# Patient Record
Sex: Female | Born: 1940 | Race: White | Hispanic: No | Smoking: Former smoker
Health system: Southern US, Community
[De-identification: ages and names within clinical notes are randomized; demographics above are authoritative.]

## PROBLEM LIST (undated history)

## (undated) DIAGNOSIS — E46 Unspecified protein-calorie malnutrition: Secondary | ICD-10-CM

## (undated) DIAGNOSIS — C439 Malignant melanoma of skin, unspecified: Secondary | ICD-10-CM

## (undated) DIAGNOSIS — I10 Essential (primary) hypertension: Secondary | ICD-10-CM

## (undated) HISTORY — PX: BREAST LUMPECTOMY: SHX2

## (undated) HISTORY — PX: MELANOMA EXCISION: SHX5266

## (undated) HISTORY — PX: TONSILLECTOMY: SUR1361

## (undated) HISTORY — PX: THORACOTOMY: SUR1349

---

## 1999-09-10 ENCOUNTER — Encounter: Admission: RE | Admit: 1999-09-10 | Discharge: 1999-09-10 | Payer: Self-pay | Admitting: Hematology & Oncology

## 1999-09-10 ENCOUNTER — Encounter: Payer: Self-pay | Admitting: Hematology & Oncology

## 2000-09-10 ENCOUNTER — Encounter: Payer: Self-pay | Admitting: Hematology & Oncology

## 2000-09-10 ENCOUNTER — Encounter: Admission: RE | Admit: 2000-09-10 | Discharge: 2000-09-10 | Payer: Self-pay | Admitting: Hematology & Oncology

## 2001-09-12 ENCOUNTER — Encounter: Admission: RE | Admit: 2001-09-12 | Discharge: 2001-09-12 | Payer: Self-pay | Admitting: Hematology & Oncology

## 2001-09-12 ENCOUNTER — Ambulatory Visit (HOSPITAL_COMMUNITY): Admission: RE | Admit: 2001-09-12 | Discharge: 2001-09-12 | Payer: Self-pay | Admitting: Hematology & Oncology

## 2001-09-12 ENCOUNTER — Encounter: Payer: Self-pay | Admitting: Hematology & Oncology

## 2002-09-14 ENCOUNTER — Ambulatory Visit (HOSPITAL_COMMUNITY): Admission: RE | Admit: 2002-09-14 | Discharge: 2002-09-14 | Payer: Self-pay | Admitting: Hematology & Oncology

## 2002-09-14 ENCOUNTER — Encounter: Payer: Self-pay | Admitting: Hematology & Oncology

## 2002-09-14 ENCOUNTER — Encounter: Admission: RE | Admit: 2002-09-14 | Discharge: 2002-09-14 | Payer: Self-pay | Admitting: Hematology & Oncology

## 2002-09-18 ENCOUNTER — Ambulatory Visit (HOSPITAL_COMMUNITY): Admission: RE | Admit: 2002-09-18 | Discharge: 2002-09-18 | Payer: Self-pay | Admitting: Hematology & Oncology

## 2002-09-18 ENCOUNTER — Encounter: Payer: Self-pay | Admitting: Hematology & Oncology

## 2002-10-05 ENCOUNTER — Encounter: Payer: Self-pay | Admitting: Thoracic Surgery (Cardiothoracic Vascular Surgery)

## 2002-10-05 ENCOUNTER — Ambulatory Visit (HOSPITAL_COMMUNITY)
Admission: RE | Admit: 2002-10-05 | Discharge: 2002-10-05 | Payer: Self-pay | Admitting: Thoracic Surgery (Cardiothoracic Vascular Surgery)

## 2002-10-17 ENCOUNTER — Encounter: Payer: Self-pay | Admitting: Thoracic Surgery (Cardiothoracic Vascular Surgery)

## 2002-10-17 ENCOUNTER — Encounter (INDEPENDENT_AMBULATORY_CARE_PROVIDER_SITE_OTHER): Payer: Self-pay | Admitting: Specialist

## 2002-10-17 ENCOUNTER — Inpatient Hospital Stay (HOSPITAL_COMMUNITY)
Admission: RE | Admit: 2002-10-17 | Discharge: 2002-10-23 | Payer: Self-pay | Admitting: Thoracic Surgery (Cardiothoracic Vascular Surgery)

## 2002-10-18 ENCOUNTER — Encounter: Payer: Self-pay | Admitting: Thoracic Surgery (Cardiothoracic Vascular Surgery)

## 2002-10-19 ENCOUNTER — Encounter: Payer: Self-pay | Admitting: Thoracic Surgery (Cardiothoracic Vascular Surgery)

## 2002-10-20 ENCOUNTER — Encounter: Payer: Self-pay | Admitting: Thoracic Surgery (Cardiothoracic Vascular Surgery)

## 2002-10-21 ENCOUNTER — Encounter: Payer: Self-pay | Admitting: Thoracic Surgery (Cardiothoracic Vascular Surgery)

## 2002-11-10 ENCOUNTER — Encounter
Admission: RE | Admit: 2002-11-10 | Discharge: 2002-11-10 | Payer: Self-pay | Admitting: Thoracic Surgery (Cardiothoracic Vascular Surgery)

## 2002-11-10 ENCOUNTER — Encounter: Payer: Self-pay | Admitting: Thoracic Surgery (Cardiothoracic Vascular Surgery)

## 2003-01-29 ENCOUNTER — Encounter: Payer: Self-pay | Admitting: Hematology & Oncology

## 2003-01-29 ENCOUNTER — Ambulatory Visit (HOSPITAL_COMMUNITY): Admission: RE | Admit: 2003-01-29 | Discharge: 2003-01-29 | Payer: Self-pay | Admitting: Hematology & Oncology

## 2003-05-21 ENCOUNTER — Ambulatory Visit (HOSPITAL_COMMUNITY): Admission: RE | Admit: 2003-05-21 | Discharge: 2003-05-21 | Payer: Self-pay | Admitting: Hematology & Oncology

## 2003-08-22 ENCOUNTER — Ambulatory Visit (HOSPITAL_COMMUNITY): Admission: RE | Admit: 2003-08-22 | Discharge: 2003-08-22 | Payer: Self-pay | Admitting: Hematology & Oncology

## 2004-01-09 ENCOUNTER — Ambulatory Visit (HOSPITAL_COMMUNITY): Admission: RE | Admit: 2004-01-09 | Discharge: 2004-01-09 | Payer: Self-pay | Admitting: Hematology & Oncology

## 2004-06-06 ENCOUNTER — Encounter: Admission: RE | Admit: 2004-06-06 | Discharge: 2004-06-06 | Payer: Self-pay | Admitting: Hematology & Oncology

## 2004-07-11 ENCOUNTER — Ambulatory Visit: Payer: Self-pay | Admitting: Hematology & Oncology

## 2004-07-16 ENCOUNTER — Ambulatory Visit (HOSPITAL_COMMUNITY): Admission: RE | Admit: 2004-07-16 | Discharge: 2004-07-16 | Payer: Self-pay | Admitting: Hematology & Oncology

## 2005-01-13 ENCOUNTER — Ambulatory Visit: Payer: Self-pay | Admitting: Hematology & Oncology

## 2005-01-20 ENCOUNTER — Ambulatory Visit (HOSPITAL_COMMUNITY): Admission: RE | Admit: 2005-01-20 | Discharge: 2005-01-20 | Payer: Self-pay | Admitting: Hematology & Oncology

## 2005-07-10 ENCOUNTER — Ambulatory Visit: Payer: Self-pay | Admitting: Hematology & Oncology

## 2005-07-15 ENCOUNTER — Ambulatory Visit (HOSPITAL_COMMUNITY): Admission: RE | Admit: 2005-07-15 | Discharge: 2005-07-15 | Payer: Self-pay | Admitting: Hematology & Oncology

## 2005-07-20 ENCOUNTER — Encounter: Admission: RE | Admit: 2005-07-20 | Discharge: 2005-07-20 | Payer: Self-pay | Admitting: Hematology & Oncology

## 2006-01-05 ENCOUNTER — Ambulatory Visit: Payer: Self-pay | Admitting: Hematology & Oncology

## 2006-01-07 ENCOUNTER — Ambulatory Visit (HOSPITAL_COMMUNITY): Admission: RE | Admit: 2006-01-07 | Discharge: 2006-01-07 | Payer: Self-pay | Admitting: Hematology & Oncology

## 2006-01-07 LAB — CBC WITH DIFFERENTIAL/PLATELET
Basophils Absolute: 0 10*3/uL (ref 0.0–0.1)
Eosinophils Absolute: 0.1 10*3/uL (ref 0.0–0.5)
HGB: 15.3 g/dL (ref 11.6–15.9)
LYMPH%: 24.4 % (ref 14.0–48.0)
MCV: 84.5 fL (ref 81.0–101.0)
MONO#: 0.4 10*3/uL (ref 0.1–0.9)
MONO%: 5.6 % (ref 0.0–13.0)
NEUT#: 5.1 10*3/uL (ref 1.5–6.5)
Platelets: 222 10*3/uL (ref 145–400)
RBC: 5.18 10*6/uL (ref 3.70–5.32)
RDW: 14 % (ref 11.3–14.5)
WBC: 7.5 10*3/uL (ref 3.9–10.0)

## 2006-01-07 LAB — COMPREHENSIVE METABOLIC PANEL
Albumin: 3.9 g/dL (ref 3.5–5.2)
Alkaline Phosphatase: 71 U/L (ref 39–117)
BUN: 11 mg/dL (ref 6–23)
CO2: 29 mEq/L (ref 19–32)
Glucose, Bld: 101 mg/dL — ABNORMAL HIGH (ref 70–99)
Potassium: 4.2 mEq/L (ref 3.5–5.3)
Sodium: 139 mEq/L (ref 135–145)
Total Protein: 6.5 g/dL (ref 6.0–8.3)

## 2006-10-26 ENCOUNTER — Encounter: Admission: RE | Admit: 2006-10-26 | Discharge: 2006-10-26 | Payer: Self-pay | Admitting: Hematology & Oncology

## 2007-01-11 ENCOUNTER — Ambulatory Visit: Payer: Self-pay | Admitting: Hematology & Oncology

## 2007-01-13 ENCOUNTER — Ambulatory Visit (HOSPITAL_COMMUNITY): Admission: RE | Admit: 2007-01-13 | Discharge: 2007-01-13 | Payer: Self-pay | Admitting: Hematology & Oncology

## 2007-01-13 LAB — COMPREHENSIVE METABOLIC PANEL
Albumin: 4.5 g/dL (ref 3.5–5.2)
Alkaline Phosphatase: 83 U/L (ref 39–117)
BUN: 19 mg/dL (ref 6–23)
CO2: 27 mEq/L (ref 19–32)
Calcium: 9.8 mg/dL (ref 8.4–10.5)
Chloride: 105 mEq/L (ref 96–112)
Glucose, Bld: 101 mg/dL — ABNORMAL HIGH (ref 70–99)
Potassium: 4.3 mEq/L (ref 3.5–5.3)
Sodium: 141 mEq/L (ref 135–145)
Total Protein: 7.2 g/dL (ref 6.0–8.3)

## 2007-01-13 LAB — CBC WITH DIFFERENTIAL/PLATELET
Basophils Absolute: 0 10*3/uL (ref 0.0–0.1)
Eosinophils Absolute: 0.1 10*3/uL (ref 0.0–0.5)
HGB: 15.4 g/dL (ref 11.6–15.9)
MONO#: 0.5 10*3/uL (ref 0.1–0.9)
NEUT#: 5.1 10*3/uL (ref 1.5–6.5)
RBC: 5.26 10*6/uL (ref 3.70–5.32)
RDW: 13.6 % (ref 11.3–14.5)
WBC: 7.5 10*3/uL (ref 3.9–10.0)
lymph#: 1.9 10*3/uL (ref 0.9–3.3)

## 2008-01-03 ENCOUNTER — Ambulatory Visit: Payer: Self-pay | Admitting: Hematology & Oncology

## 2008-01-05 ENCOUNTER — Ambulatory Visit (HOSPITAL_BASED_OUTPATIENT_CLINIC_OR_DEPARTMENT_OTHER): Admission: RE | Admit: 2008-01-05 | Discharge: 2008-01-05 | Payer: Self-pay | Admitting: Hematology & Oncology

## 2008-01-05 LAB — CBC WITH DIFFERENTIAL (CANCER CENTER ONLY)
Eosinophils Absolute: 0.1 10*3/uL (ref 0.0–0.5)
HGB: 15.9 g/dL (ref 11.6–15.9)
MCV: 85 fL (ref 81–101)
MONO#: 0.5 10*3/uL (ref 0.1–0.9)
NEUT#: 5.4 10*3/uL (ref 1.5–6.5)
Platelets: 198 10*3/uL (ref 145–400)
RBC: 5.45 10*6/uL — ABNORMAL HIGH (ref 3.70–5.32)
WBC: 8.3 10*3/uL (ref 3.9–10.0)

## 2008-01-05 LAB — COMPREHENSIVE METABOLIC PANEL
Alkaline Phosphatase: 74 U/L (ref 39–117)
BUN: 14 mg/dL (ref 6–23)
CO2: 26 mEq/L (ref 19–32)
Glucose, Bld: 88 mg/dL (ref 70–99)
Total Bilirubin: 0.5 mg/dL (ref 0.3–1.2)
Total Protein: 6.9 g/dL (ref 6.0–8.3)

## 2008-12-26 ENCOUNTER — Ambulatory Visit: Payer: Self-pay | Admitting: Hematology & Oncology

## 2008-12-27 ENCOUNTER — Ambulatory Visit (HOSPITAL_BASED_OUTPATIENT_CLINIC_OR_DEPARTMENT_OTHER): Admission: RE | Admit: 2008-12-27 | Discharge: 2008-12-27 | Payer: Self-pay | Admitting: Hematology & Oncology

## 2008-12-27 ENCOUNTER — Ambulatory Visit: Payer: Self-pay | Admitting: Diagnostic Radiology

## 2008-12-27 LAB — CBC WITH DIFFERENTIAL (CANCER CENTER ONLY)
EOS%: 1.4 % (ref 0.0–7.0)
Eosinophils Absolute: 0.1 10*3/uL (ref 0.0–0.5)
LYMPH#: 2.3 10*3/uL (ref 0.9–3.3)
MCH: 28.8 pg (ref 26.0–34.0)
MONO%: 6.6 % (ref 0.0–13.0)
NEUT#: 4.2 10*3/uL (ref 1.5–6.5)
Platelets: 186 10*3/uL (ref 145–400)
RBC: 5.01 10*6/uL (ref 3.70–5.32)

## 2008-12-27 LAB — COMPREHENSIVE METABOLIC PANEL
ALT: 13 U/L (ref 0–35)
AST: 13 U/L (ref 0–37)
Alkaline Phosphatase: 65 U/L (ref 39–117)
CO2: 29 mEq/L (ref 19–32)
Creatinine, Ser: 0.61 mg/dL (ref 0.40–1.20)
Sodium: 143 mEq/L (ref 135–145)
Total Bilirubin: 0.6 mg/dL (ref 0.3–1.2)
Total Protein: 6.7 g/dL (ref 6.0–8.3)

## 2009-01-09 ENCOUNTER — Encounter: Admission: RE | Admit: 2009-01-09 | Discharge: 2009-01-09 | Payer: Self-pay | Admitting: Hematology & Oncology

## 2009-05-06 ENCOUNTER — Emergency Department: Payer: Self-pay | Admitting: Emergency Medicine

## 2009-05-09 ENCOUNTER — Ambulatory Visit: Payer: Self-pay | Admitting: Internal Medicine

## 2009-12-25 ENCOUNTER — Ambulatory Visit: Payer: Self-pay | Admitting: Hematology & Oncology

## 2010-01-22 ENCOUNTER — Ambulatory Visit: Payer: Self-pay | Admitting: Diagnostic Radiology

## 2010-01-22 ENCOUNTER — Ambulatory Visit (HOSPITAL_BASED_OUTPATIENT_CLINIC_OR_DEPARTMENT_OTHER): Admission: RE | Admit: 2010-01-22 | Discharge: 2010-01-22 | Payer: Self-pay | Admitting: Hematology & Oncology

## 2010-01-22 LAB — CBC WITH DIFFERENTIAL (CANCER CENTER ONLY)
Eosinophils Absolute: 0.1 10*3/uL (ref 0.0–0.5)
HCT: 45.6 % (ref 34.8–46.6)
LYMPH#: 2.7 10*3/uL (ref 0.9–3.3)
LYMPH%: 29 % (ref 14.0–48.0)
MCV: 87 fL (ref 81–101)
MONO#: 0.6 10*3/uL (ref 0.1–0.9)
Platelets: 220 10*3/uL (ref 145–400)
RBC: 5.21 10*6/uL (ref 3.70–5.32)
WBC: 9.4 10*3/uL (ref 3.9–10.0)

## 2010-01-23 LAB — COMPREHENSIVE METABOLIC PANEL
ALT: 14 U/L (ref 0–35)
Albumin: 4.5 g/dL (ref 3.5–5.2)
CO2: 27 mEq/L (ref 19–32)
Calcium: 9.9 mg/dL (ref 8.4–10.5)
Chloride: 106 mEq/L (ref 96–112)
Glucose, Bld: 119 mg/dL — ABNORMAL HIGH (ref 70–99)
Sodium: 145 mEq/L (ref 135–145)
Total Bilirubin: 0.4 mg/dL (ref 0.3–1.2)
Total Protein: 6.9 g/dL (ref 6.0–8.3)

## 2010-01-23 LAB — VITAMIN D 25 HYDROXY (VIT D DEFICIENCY, FRACTURES): Vit D, 25-Hydroxy: 18 ng/mL — ABNORMAL LOW (ref 30–89)

## 2010-01-24 ENCOUNTER — Encounter: Admission: RE | Admit: 2010-01-24 | Discharge: 2010-01-24 | Payer: Self-pay | Admitting: Hematology & Oncology

## 2010-06-07 ENCOUNTER — Other Ambulatory Visit: Payer: Self-pay | Admitting: Hematology & Oncology

## 2010-06-07 DIAGNOSIS — C3490 Malignant neoplasm of unspecified part of unspecified bronchus or lung: Secondary | ICD-10-CM

## 2010-10-03 NOTE — Discharge Summary (Signed)
NAME:  Victoria Castro, Victoria Castro                          ACCOUNT NO.:  [id number]   MEDICAL RECORD NO.:  [id number]                   PATIENT TYPE:  INP   LOCATION:  3312                                 FACILITY:  [facility]   PHYSICIAN:  Salvatore Decent. Dorris Fetch, M.D.         DATE OF BIRTH:  21-Apr-1941   DATE OF ADMISSION:  10/17/2002  DATE OF DISCHARGE:  10/22/2002                                 DISCHARGE SUMMARY   ONCOLOGIST:  Dr. Arlan Organ.   PRIMARY CARE PHYSICIAN:  Dr. Lilyan Punt of the [facility] in [place].   DISCHARGE DIAGNOSIS:  Invasive, moderately differentiated adenocarcinoma,  right lower lobe, all lymph nodes sampled are free of tumor, I-A.   SECONDARY DIAGNOSES:  1. 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  2. History of tobacco habituation with a 30-pack-year history.  3. Hypertension.   PROCEDURES:  On October 17, 2002, bronchoscopy, right video-assisted  thoracoscopy with wedge resection of the superior segment of the right lower  lobe, right thoracotomy, right lower lobectomy, and lymph node biopsies by  Dr. Charlett Lango.  Patient tolerated the procedure well and was  transferred stable to the recovery room.   DISCHARGE DISPOSITION:  Victoria Castro is ready for discharge on  postoperative day number five after undergoing right lower lobectomy.  She  has been afebrile in the postoperative period.  She is ambulating  independently.  Her incision is healing nicely.  She was on epidural  catheter in the immediate postoperative period; the was removed on  postoperative day number two, and she has controlled her pain well with  Tylenol and Percocet.  She did have some postoperative nausea and anorexia,  but this has resolved by postoperative day number four.  She was seen by Dr.  Arlan Organ, oncologist,  and, based on the pathology studies, he does not  favor adjuvant chemotherapy; he wishes to follow up with her in four to six  weeks.  He has ordered estrogen, progesterone, and HER-2 studies on the CCU.  The patient did have two chest tubes postoperatively; they did not leak at  any time in the postoperative period.  The anterior tube was removed on  postoperative day number two and the remaining tube removed on postoperative  day number three.  She had no pneumothorax after removal of her chest tubes.   DISCHARGE MEDICATIONS:  She will go home with the following medications:  1. Percocet 5/325 mg one to two tabs p.o. q.4-6h. p.r.n. pain.  2. Lotrel 5/20 mg one p.o. daily.   ACTIVITY:  1. She is to walk daily to keep up her strength.  2. She is not to drive until she sees Dr. Dorris Fetch.   DIET:  Her discharge diet has no restrictions.  DISCHARGE INSTRUCTIONS:  1. Victoria Castro may shower.  2. She is to call Dr. Sunday Corn office if her incision becomes inflamed     or starts to drain.   FOLLOW UP:  1. She will see Dr. Dorris Fetch on Friday, November 10, 2002, at 1 o'clock in     the afternoon.  2. She is to get a chest x-ray at [facility] one hour     before the visit with Dr. Dorris Fetch.  3. She will see Dr. Myna Hidalgo at [facility] in four to six     weeks; she is to call [phone] to arrange that appointment.   BRIEF HISTORY:  Victoria Castro is a 71 year old female with a history of  hypertension and right breast cancer, treated in 1995; she had a lumpectomy  with axillary dissection, postoperative radiation, and five years of  Tamoxifen.  She has been disease-free since that time.  She has an annual  chest x-ray, primarily because of her smoking habit, which she continues  with an occasional cigarette here and there, and the chest x-ray ordered by  Dr. Myna Hidalgo showed a solitary right, what appeared to be, upper lobe nodule.  A computed tomogram  of the chest was performed; this confirmed the presence  of a 1.8-cm mass in the posterior aspect of the chest, probably in the  superior aspect of the right lower lobe.  This was a symmetrical mass.  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  The remainder of her  abdomen and pelvis exam was negative.  Victoria Castro has a cough, which is  nonproductive; she thinks it is probably due to seasonal allergies.  She has  had an approximately 8-pound weight loss over the past one year, but she has  been trying to lose weight.  She has no excessive sputum production or  history of hemoptysis.  The patient presents on October 17, 2002, for right  video-assisted thoracoscopy and possible right lower lobectomy, depending on  the pathology from wedge resection of the superior segment of the right  lower lobe.  Dr. Dorris Fetch was the surgeon.  Description of hospital  course is as described in the discharge disposition.  The patient went home  on postoperative day number five in stable and improved health.     Maple Mirza, P.A.                    Salvatore Decent Dorris Fetch, M.D.    GM/MEDQ  D:  10/20/2002  T:  10/21/2002  Job:  [id number]   cc:   Rose Phi. Myna Hidalgo, M.D.  [address] [facility]  [place], [place] [address]  Fax: [phone]   Salvatore Decent. Dorris Fetch, M.D.  [address]  [place]  [place] [address]  Fax: [phone]   Scott A. Gerda Diss, M.D.  [address]  [place]  [place] [address]  Fax: [phone]

## 2010-10-03 NOTE — Consult Note (Signed)
NAME:  Victoria Castro, Victoria Castro                          ACCOUNT NO.:  [id number]   MEDICAL RECORD NO.:  [id number]                   PATIENT TYPE:  INP   LOCATION:  3312                                 FACILITY:  [facility]   PHYSICIAN:  Rose Phi. Myna Hidalgo, M.D.              DATE OF BIRTH:  Mar 11, 1941   DATE OF CONSULTATION:  10/18/2002  DATE OF DISCHARGE:                                   CONSULTATION   REFERRING PHYSICIAN:  Salvatore Decent. Dorris Fetch, M.D.   REASON FOR CONSULTATION:  Stage IA versus IB bronchogenic carcinoma of the  right upper lobe.   HISTORY OF PRESENT ILLNESS:  Victoria Castro is a very charming 70 year old white  female who is well known to me.  She was diagnosed with stage 1 infiltrating  ductal carcinoma of the right breast back in 1995.  She underwent  postoperative radiation therapy after the lumpectomy.  She received 5 years  of tamoxifen.   We have done annual chest x-rays on her. This is done because of her smoking  history.  She has a 30-pack-year history of tobacco use.  She still has an  occasional cigarette.   On her last chest x-ray she was found to have a right upper lobe nodule.  This was then confirmed by CT scan of the chest.  A 1.8 cm nodule was noted  in the posterior aspect of the right upper lobe.  There were no other  findings.  There was no mediastinal or hilar adenopathy.  She had no disease  below the diaphragm.  There was no bony disease noted.  She had really  somewhat asymptomatic.  She does have a chronic cough.  There is no  hemoptysis.  There is no shortness of breath.  Her appetite had been good.  She was trying to loose some weight.   She was referred to Dr. Charlett Lango.  He felt that she would be a  good candidate for a VATS resection.   She was subsequently scheduled to have surgery and then her mother passed  away. Her mother had polycythemia vera.   Her surgery was rescheduled for the first of June.   Dr. Dorris Fetch went ahead and  performed a wedge resection of the superior  segment of the right lower lobe.  The mediastinal hilar lymph nodes were  biopsied.  Pathology is still pending.  She actually looks fantastic after  having the surgery by 1-day.  She has a Fentanyl epidural catheter in.  She  does have some nausea.   PAST MEDICAL HISTORY:  1. Hypertension.  2. Stage 1 ductal carcinoma of the right breast, status post lumpectomy.   ALLERGIES:  None known.   MEDICATIONS ON ADMISSION:  1. Lotrel 5/20 one p.o. daily.   SOCIAL HISTORY:  As stated previously.  In additional, there are no  occupational exposures. She has no alcohol use.   FAMILY HISTORY:  Remarkable for coronary artery disease.  Her mother had  polycythemia vera.   REVIEW OF SYSTEMS:  As stated in the history of present illness.  In  addition, there are no fever, sweats or chills. She had no headache.  There  is no bone pain especially with her arthritis.  There is no change in bowel  or bladder habits. She did not note any change in her eating habits.  There  is no swelling of her legs.  There is no pain in the lower legs.  She has  had no skin rashes.   PHYSICAL EXAMINATION:  GENERAL:  This is a fairly well-developed, well-  nourished, white female in no obvious distress.  VITAL SIGNS:  Her vital signs show a temperature of 96.6;  pulse is 110;  respiratory rate 16;  blood pressure is 120/50.  Oxygen saturation on room  air is 96%.  HEAD AND NECK:  Exam shows a normocephalic, atraumatic skull.  She has no  ocular or oral lesions.  No adenopathy is noted.  LUNGS:  Her lungs do show a test tube into the right lateral chest wall.  She does have decreased breath sounds bilaterally.  CARDIAC:  Regular rate and rhythm with no murmurs, thrills, or bruits.  ABDOMEN:  Abdominal exam soft with decreased bowel sounds.  She has no  abdominal mass.  There is no ascites.  There is no hepatosplenomegaly.  BACK:  Exam was not done secondary to the  recent surgery.  She does have the  chest tube in the right lateral chest wall.  EXTREMITIES:  Show no clubbing, cyanosis, or edema.  NEUROLOGIC:  No focal neurological deficits.   LABORATORY DATA:  Her laboratory studies (preop): White cell count 7.6,  hemoglobin 15.3, hematocrit 44.4, platelet count 212.  MCV is 85.  ProTime  12 with a PTT of 26.  Sodium 143, potassium 4.3, BUN 14, creatinine 0.6,  calcium 10 with an albumin of 4.1.  LFTs are within normal limits.   IMPRESSION:  Victoria Castro is a 70 year old female with a 9 year history of stage  I ductal carcinoma of the right breast.  She, in my opinion, has been cured  of this.  I think that the changes of recurrence after 9 years for a stage 1  breast cancer is less than 5%.  I think that we are looking at a second  primary.  We do not have the pathology report back as of yet; but, again, I  suspect that this will be a bronchogenic carcinoma.  I would favor a non-  small cell lung carcinoma.   If this is an adenocarcinoma, we will need to have pathology do  estrogen/progesterone/HER-2 analysis.   If this is bronchogenic carcinoma, I would not favor adjuvant chemotherapy  for Victoria Castro. Although recent studies do suggest a 5% benefit for adjuvant  chemotherapy, I just do not think that Victoria Castro is going to be able to  tolerate the Platinum-based regimens that were employed.   Again, we will wait for the final before making any final recommendations. I  do not believe that she needs any further staging studies.  I would be  highly surprised if she has higher than a stage 1 disease.   I really appreciate the help of Dr. Dorris Fetch in operating on Victoria Castro.  He, as always, has done an outstanding job.   I will make final recommendations once the final path is out.  Rose Phi. Myna Hidalgo, M.D.   PRE/MEDQ  D:  10/18/2002  T:  10/18/2002  Job:  [id number]   cc:   Salvatore Decent. Dorris Fetch, M.D.   [address]  [place]  [place] [address]  Fax: [phone]   [facility]   Rose Phi. Myna Hidalgo, M.D.  [address] [facility]  [place], [place] [address]  Fax: [phone]

## 2010-10-03 NOTE — Op Note (Signed)
NAME:  Victoria Castro, Victoria Castro                          ACCOUNT NO.:  [id number]   MEDICAL RECORD NO.:  [id number]                   PATIENT TYPE:  INP   LOCATION:  3312                                 FACILITY:  [facility]   PHYSICIAN:  Salvatore Decent. Dorris Fetch, M.D.         DATE OF BIRTH:  Apr 03, 1941   DATE OF PROCEDURE:  10/17/2002  DATE OF DISCHARGE:                                 OPERATIVE REPORT   PREOPERATIVE DIAGNOSIS:  Right lung nodule.   POSTOPERATIVE DIAGNOSIS:  Non-small cell carcinoma, superior segment, right  lower lobe.   PROCEDURE:  Bronchoscopy, right VATs wedge resection, right thoracotomy,  right lower lobectomy, mediastinal lymph node dissection.   SURGEON:  Salvatore Decent. Dorris Fetch, M.D.   ASSISTANT:  Maple Mirza, P.A.   ANESTHESIA:  General.   FINDINGS:  Bronchoscopy revealed moderate bloody secretions of unclear  origin, no endobronchial lesions seen.  Normal endobronchial anatomy.  VATS  approximately 2 cm mass in the superior segment of the right lower lobe with  puckering of the visceral pleura.  Normal appearing hilar and mediastinal  lymph nodes.  Frozen section of mass adenocarcinoma with some squamous  features, likely lung primary.   CLINICAL NOTE:  Victoria Castro is a 70 year old female with a history of breast  cancer nine years ago.  She has been disease free since that time.  She  presents with a newly discovered lung mass that was discovered on a routine  screening chest x-ray.  The patient was referred for surgical evaluation.  It was recommended that she undergo bronchoscopy as well as a VATs with  wedge resection and possible lobectomy depending on interoperative findings.  The indications, risks, benefits, alternative treatments, as well as  interoperative decision making were discussed in detail with the patient and  her family.  She understood and accepted the risks and agreed to proceed  with surgery.   OPERATIVE NOTE:  Victoria Castro was brought to the  preop holding area on October 17, 2002.  In the preop holding area, venous access was obtained by anesthesia  but there was difficulty obtaining central venous access.  An arterial blood  pressure monitoring catheter was placed and intravenous antibiotics were  administered.  The patient was taken to the operating room, anesthetized,  and intubated with a single lumen endotracheal.  Flexible fiberoptic  bronchoscopy was carried out via the endotracheal tube.  There was normal  endobronchial anatomy.  There were moderate bloody secretions, primarily on  the right greater than left but no clear source was seen and there were no  endobronchial lesions seen.  The tube was converted to a double lumen  endotracheal tube.  I then performed a right subclavian vein double lumen  catheterization using Seldinger technique and sterile technique.  The vein  was accessed on the first pass, the wire passed easily, and the catheter  passed easily, as well.  There was excellent return from both  ports.  The  catheter was secured with 3-0 silk sutures.  PSOs were placed for DVT  prophylaxis.  The patient was placed in the left lateral decubitus position.  The right chest was prepped and draped in the usual fashion.   A transverse incision was made in the 7th intercostal space in the  midaxillary line.  It was carried through the skin and subcutaneous tissue.  The chest was entered bluntly using a hemostat.  A port was inserted.  The  right lung was deflated.  The patient tolerated single lung ventilation  well.  An additional incision was made in approximately the fifth  intercostal space posteriorly.  Another incision was made in the fifth  intercostal space more anteriorly in the anterior axillary line.  The lung  was then inspected.  The parietal pleural surface appeared normal.  There  was no pleural effusion.  There was a 2 cm mass in the superior segment of  the right lower lobe.  There was puckering of  the visceral pleura.  This  area was excised with multiple firings of an ATB-45 stapler and this wedge  resection was sent for frozen section.  Frozen section showed  adenocarcinoma.  It could not definitely be determined by frozen section  that this was a lung primary but given the appearance on CT scan which was  consistent with a lung primary as well as the appearance thoracoscopically  with involvement of the visceral pleura, all signs pointed to this being a  primary lung cancer.  Therefore, the decision was made to proceed with right  lower lobectomy.   A small posterolateral thoracotomy was performed.  The latissimus muscle was  divided.  The serratus muscle was retracted anteriorly and was spared.  The  sixth rib was shingled posteriorly and the chest was entered through the  fifth intercostal space.  There was good exposure.  The entire lung was  palpated, there were no other palpable nodules within the lung.  The  inferior pulmonary ligament was divided with electrocautery.  There was an  inferior pulmonary ligament node which was taken with the specimen.  The  pleural reflection was divided at the hilum circumferentially.  The patient  had nearly complete fissures and the pleura overlying the pulmonary artery  branches to the lower lobe was incised and the pulmonary arterial branches  were carefully dissected out.  The pulmonary artery was divided with an ATW-  35 stapler.  The superior segmental artery was divided separately.  The  basilar segmental arteries were divided as a unit.  There was level 11 lymph  nodes present at the bronchus, these were sampled and sent separately.  The  lower lobe bronchus was clearly visible.  The TL-30 stapler was placed  across the lower lobe bronchus and tightened.  A chest inflation revealed  good aeration of the upper and middle lobes and no aeration of the lower lobe.  The stapler was fired and the bronchus was divided.  The major  fissure  was completed with the single firing of an ATB-45 stapler.  This  left only the inferior pulmonary vein from the lower lobe which was divided  with an ATW-35 stapler.  The specimen was removed and sent for bronchial  margins.  Mediastinal lymph node dissection was carried out.  The subcarinal  space was explored.  There were large friable nodes in the subcarinal space  though these did appear benign and reactive with no gross evidence of tumor.  Multiple biopsies of all the seven nodes were sent.  The right paratracheal  space was dissected out, the overlying pleura was incised with  electrocautery.  No lymph nodes were resected en mass.  Hemostasis was  achieved with a combination of electrocautery and surgical clips as  indicated.  Level 2 and 4 nodes were sent as a unit.  The level 10 nodes,  likewise, were taken and sent for pathology, as well.   Hemostasis was achieved.  The chest was irrigated with 1 liter of warm  normal saline and 1 gram of vancomycin.  A test inflation to 30 cm of water  revealed no leakage from the bronchial stump or the residual staple lines.  A 28 French straight chest tube was placed anteriorly and a 36 French right  angle tube was placed posteriorly along the paravertebral gutter.  Both were  secured with #1 silk sutures.  The ribs were reapproximated with #2 Vicryl  pericostal sutures, the serratus muscle was reattached laterally with a  running 0 Vicryl suture, the latissimus was closed with running 0 Vicryl  suture, the subcutaneous tissue and skin were closed in standard fashion  with a subcuticular skin closure.  All sponge, instrument, and needle counts  were correct at the end of the procedure.  At  the completion of the procedure, the patient remained intubated in the left  lateral decubitus position.  An epidural catheter was placed by anesthesia.  The patient then was extubated in the operating room and taken to the  recovery room in stable  condition.                                               Salvatore Decent Dorris Fetch, M.D.    SCH/MEDQ  D:  10/17/2002  T:  10/17/2002  Job:  [id number]   cc:   Rose Phi. Myna Hidalgo, M.D.  [address] [facility]  [place], [place] [address]  Fax: [phone]   [name]  [address]  [place]  [place] [address]  Fax: [phone]

## 2010-11-07 ENCOUNTER — Ambulatory Visit: Payer: Self-pay | Admitting: Internal Medicine

## 2011-01-22 ENCOUNTER — Ambulatory Visit (HOSPITAL_BASED_OUTPATIENT_CLINIC_OR_DEPARTMENT_OTHER)
Admission: RE | Admit: 2011-01-22 | Discharge: 2011-01-22 | Disposition: A | Discharge status: Home / Self Care | Payer: Medicare Other | Source: Ambulatory Visit | Attending: Hematology & Oncology | Admitting: Hematology & Oncology

## 2011-01-22 DIAGNOSIS — C3490 Malignant neoplasm of unspecified part of unspecified bronchus or lung: Secondary | ICD-10-CM

## 2011-01-29 ENCOUNTER — Encounter (HOSPITAL_BASED_OUTPATIENT_CLINIC_OR_DEPARTMENT_OTHER): Payer: 59 | Admitting: Hematology & Oncology

## 2011-01-29 ENCOUNTER — Other Ambulatory Visit: Payer: Self-pay | Admitting: Hematology & Oncology

## 2011-01-29 DIAGNOSIS — Z85118 Personal history of other malignant neoplasm of bronchus and lung: Secondary | ICD-10-CM

## 2011-01-29 DIAGNOSIS — Z853 Personal history of malignant neoplasm of breast: Secondary | ICD-10-CM

## 2011-01-29 DIAGNOSIS — C3490 Malignant neoplasm of unspecified part of unspecified bronchus or lung: Secondary | ICD-10-CM

## 2012-01-28 ENCOUNTER — Other Ambulatory Visit (HOSPITAL_BASED_OUTPATIENT_CLINIC_OR_DEPARTMENT_OTHER): Payer: 59

## 2012-02-24 ENCOUNTER — Ambulatory Visit (HOSPITAL_BASED_OUTPATIENT_CLINIC_OR_DEPARTMENT_OTHER): Payer: Medicare Other | Admitting: Hematology & Oncology

## 2012-02-24 ENCOUNTER — Other Ambulatory Visit (HOSPITAL_BASED_OUTPATIENT_CLINIC_OR_DEPARTMENT_OTHER): Payer: Medicare Other | Admitting: Lab

## 2012-02-24 ENCOUNTER — Ambulatory Visit (HOSPITAL_BASED_OUTPATIENT_CLINIC_OR_DEPARTMENT_OTHER)
Admission: RE | Admit: 2012-02-24 | Discharge: 2012-02-24 | Disposition: A | Discharge status: Home / Self Care | Payer: Medicare Other | Source: Ambulatory Visit | Attending: Hematology & Oncology | Admitting: Hematology & Oncology

## 2012-02-24 VITALS — BP 148/80 | HR 87 | Temp 98.0°F | Resp 16 | Ht 62.0 in | Wt 130.0 lb

## 2012-02-24 DIAGNOSIS — C3410 Malignant neoplasm of upper lobe, unspecified bronchus or lung: Secondary | ICD-10-CM

## 2012-02-24 DIAGNOSIS — Z85118 Personal history of other malignant neoplasm of bronchus and lung: Secondary | ICD-10-CM

## 2012-02-24 DIAGNOSIS — E559 Vitamin D deficiency, unspecified: Secondary | ICD-10-CM

## 2012-02-24 DIAGNOSIS — C3490 Malignant neoplasm of unspecified part of unspecified bronchus or lung: Secondary | ICD-10-CM

## 2012-02-24 DIAGNOSIS — Z853 Personal history of malignant neoplasm of breast: Secondary | ICD-10-CM

## 2012-02-24 DIAGNOSIS — C50919 Malignant neoplasm of unspecified site of unspecified female breast: Secondary | ICD-10-CM

## 2012-02-24 DIAGNOSIS — M810 Age-related osteoporosis without current pathological fracture: Secondary | ICD-10-CM

## 2012-02-24 LAB — CBC WITH DIFFERENTIAL (CANCER CENTER ONLY)
BASO#: 0 10*3/uL (ref 0.0–0.2)
Eosinophils Absolute: 0.1 10*3/uL (ref 0.0–0.5)
HCT: 45.4 % (ref 34.8–46.6)
LYMPH%: 28.9 % (ref 14.0–48.0)
MCV: 86 fL (ref 81–101)
MONO#: 0.6 10*3/uL (ref 0.1–0.9)
NEUT%: 61.9 % (ref 39.6–80.0)
RBC: 5.29 10*6/uL (ref 3.70–5.32)
WBC: 8.2 10*3/uL (ref 3.9–10.0)

## 2012-02-24 LAB — CMP (CANCER CENTER ONLY)
ALT(SGPT): 21 U/L (ref 10–47)
BUN, Bld: 17 mg/dL (ref 7–22)
CO2: 29 mEq/L (ref 18–33)
Calcium: 9.7 mg/dL (ref 8.0–10.3)
Chloride: 101 mEq/L (ref 98–108)
Creat: 0.6 mg/dl (ref 0.6–1.2)

## 2012-02-24 NOTE — Patient Instructions (Signed)
Call if problems 

## 2012-02-24 NOTE — Progress Notes (Signed)
This office note has been dictated.

## 2012-02-25 ENCOUNTER — Telehealth: Payer: Self-pay | Admitting: Hematology & Oncology

## 2012-02-25 NOTE — Telephone Encounter (Signed)
Pt aware of 02-22-2013 appointments for lab,CT and MD

## 2012-02-25 NOTE — Progress Notes (Signed)
CC:   [name]  DIAGNOSIS: 1. Stage IA non-small cell lung cancer of the right lung, clinical     remission. 2. Stage IIA (T2 N0 M0) ductal carcinoma of the right breast, clinical     remission.  CURRENT THERAPY:  Observation.  INTERIM HISTORY:  Ms. Lehn comes in for followup.  She comes in yearly. She is doing well.  She has had no problems since we last saw her.  As always, she got a CT scan today.  This is so we can followup on her lung nodules, which she has had.  Thankfully, the CT scan did not show any evidence of active disease in her chest.  She has some postoperative changes.  There is no pathologic lymphadenopathy or suspicious pulmonary nodules.  She has had no change in her medications.  She hates taking medications.  She has had no abdominal pain.  There have been some issues with gallbladder but she has not had this worked on.  She has been traveling.  She went to the beach this summer.  As always, she had a good time.  She has had no leg swelling.  There have been no rashes.  The patient has had no cough or shortness of breath.  There has been no change in bowel or bladder habits.  PHYSICAL EXAMINATION:  This is a elderly but well-nourished white female in no obvious distress.  Vital signs:  98, pulse 93, respiratory rate 16, blood pressure 148/80.  Weight is 130.  Head and neck: Normocephalic, atraumatic skull.  There are no ocular or oral lesions. There are no palpable cervical or supraclavicular lymph nodes.  Lungs: Clear bilaterally.  Cardiac:  Regular rate and rhythm with a normal S1, S2.  There are no murmurs, rubs, or bruits.  Breasts:  The left breast with no masses, edema or erythema.  There is no left axillary adenopathy.  Right breast is somewhat contracted from radiation surgery. There is a well-healed lumpectomy at the 10 o'clock position.  No distinct mass is noted in the right breast.  There is no right axillary adenopathy.  Abdomen:  Soft  with good bowel sounds.  There is no palpable abdominal mass.  There is no palpable hepatosplenomegaly. Back:  Well-healed thoracotomy scar in the right lateral chest wall.  No tenderness is noted over the chest wall scar.  There is no tenderness over the spine, ribs, or hips.  Extremities:  No clubbing, cyanosis or edema.  Neurological:  No focal neurological deficits.  LABORATORY STUDIES:  White cell count is 8.2, hemoglobin 15.5, hematocrit 45.4, platelet count 203.  Sodium 142, potassium 4.3, BUN 17, creatinine 0.6.  Liver function tests normal.  Vitamin D is only 11.  IMPRESSION:  Ms. Holsten is a 71 year old white female with 2 separate primary malignancies.  I really believe that she is cured of both.  She had the non-small-cell lung cancer about 6 years ago.  Her breast cancer was about 11 years ago.  There is still that risk of recurrence.  Ms. Parilla likes to have the peace of mind of a CT scan.  I certainly cannot argue with this.  Her vitamin D is awfully low.  She really needs to be on supplemental vitamin D.  We will see about her taking 2000 units a day.  We will plan to get her back in 1 year.    ______________________________ Josph Macho, M.D. PRE/MEDQ  D:  02/24/2012  T:  02/25/2012  Job:  [id number] 

## 2012-09-23 ENCOUNTER — Ambulatory Visit: Payer: Self-pay | Admitting: Emergency Medicine

## 2012-09-23 LAB — URINALYSIS, COMPLETE
Glucose,UR: NEGATIVE mg/dL (ref 0–75)
Nitrite: NEGATIVE
Ph: 6 (ref 4.5–8.0)

## 2012-09-25 LAB — URINE CULTURE

## 2012-11-30 ENCOUNTER — Ambulatory Visit: Payer: Self-pay | Admitting: Ophthalmology

## 2013-01-04 ENCOUNTER — Ambulatory Visit: Payer: Self-pay | Admitting: Ophthalmology

## 2013-02-22 ENCOUNTER — Ambulatory Visit (HOSPITAL_BASED_OUTPATIENT_CLINIC_OR_DEPARTMENT_OTHER): Payer: Medicare Other | Admitting: Hematology & Oncology

## 2013-02-22 ENCOUNTER — Ambulatory Visit (HOSPITAL_BASED_OUTPATIENT_CLINIC_OR_DEPARTMENT_OTHER)
Admission: RE | Admit: 2013-02-22 | Discharge: 2013-02-22 | Disposition: A | Discharge status: Home / Self Care | Payer: Medicare Other | Source: Ambulatory Visit | Attending: Hematology & Oncology | Admitting: Hematology & Oncology

## 2013-02-22 ENCOUNTER — Other Ambulatory Visit (HOSPITAL_BASED_OUTPATIENT_CLINIC_OR_DEPARTMENT_OTHER): Payer: Medicare Other | Admitting: Lab

## 2013-02-22 VITALS — BP 139/62 | HR 89 | Temp 97.9°F | Resp 14 | Ht 63.0 in | Wt 131.0 lb

## 2013-02-22 DIAGNOSIS — Z9010 Acquired absence of unspecified breast and nipple: Secondary | ICD-10-CM | POA: Insufficient documentation

## 2013-02-22 DIAGNOSIS — Z85118 Personal history of other malignant neoplasm of bronchus and lung: Secondary | ICD-10-CM

## 2013-02-22 DIAGNOSIS — I700 Atherosclerosis of aorta: Secondary | ICD-10-CM | POA: Insufficient documentation

## 2013-02-22 DIAGNOSIS — Z902 Acquired absence of lung [part of]: Secondary | ICD-10-CM | POA: Insufficient documentation

## 2013-02-22 DIAGNOSIS — C50919 Malignant neoplasm of unspecified site of unspecified female breast: Secondary | ICD-10-CM

## 2013-02-22 DIAGNOSIS — C3410 Malignant neoplasm of upper lobe, unspecified bronchus or lung: Secondary | ICD-10-CM

## 2013-02-22 DIAGNOSIS — M810 Age-related osteoporosis without current pathological fracture: Secondary | ICD-10-CM

## 2013-02-22 DIAGNOSIS — Z853 Personal history of malignant neoplasm of breast: Secondary | ICD-10-CM | POA: Insufficient documentation

## 2013-02-22 DIAGNOSIS — I2510 Atherosclerotic heart disease of native coronary artery without angina pectoris: Secondary | ICD-10-CM | POA: Insufficient documentation

## 2013-02-22 DIAGNOSIS — C50911 Malignant neoplasm of unspecified site of right female breast: Secondary | ICD-10-CM

## 2013-02-22 LAB — CBC WITH DIFFERENTIAL (CANCER CENTER ONLY)
BASO%: 0.2 % (ref 0.0–2.0)
HCT: 45.9 % (ref 34.8–46.6)
HGB: 15.4 g/dL (ref 11.6–15.9)
LYMPH#: 2.1 10*3/uL (ref 0.9–3.3)
MONO#: 0.6 10*3/uL (ref 0.1–0.9)
NEUT%: 67.4 % (ref 39.6–80.0)
RDW: 13.9 % (ref 11.1–15.7)
WBC: 8.5 10*3/uL (ref 3.9–10.0)

## 2013-02-22 LAB — CMP (CANCER CENTER ONLY)
ALT(SGPT): 20 U/L (ref 10–47)
AST: 17 U/L (ref 11–38)
Albumin: 4.1 g/dL (ref 3.3–5.5)
BUN, Bld: 17 mg/dL (ref 7–22)
Calcium: 9.8 mg/dL (ref 8.0–10.3)
Chloride: 101 mEq/L (ref 98–108)
Potassium: 4.1 mEq/L (ref 3.3–4.7)
Sodium: 140 mEq/L (ref 128–145)
Total Protein: 7.4 g/dL (ref 6.4–8.1)

## 2013-02-22 NOTE — Progress Notes (Signed)
This office note has been dictated.

## 2013-02-23 LAB — VITAMIN D 25 HYDROXY (VIT D DEFICIENCY, FRACTURES): Vit D, 25-Hydroxy: 33 ng/mL (ref 30–89)

## 2013-02-23 NOTE — Progress Notes (Signed)
CC:   Victoria Castro  DIAGNOSES: 1. 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 2. 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  CURRENT THERAPY:  Observation.  INTERIM HISTORY:  Victoria Castro comes in for her yearly followup.  She is doing well.  She has had no problems since we last saw her a year ago.  She did have a CT scan done today.  I reviewed this with the patient and her daughter.  The CT scan did not show any evidence of recurrent disease.  She does have atherosclerosis.  She had coronary artery calcifications in 2 of her coronary arteries.  Her daughter will see about getting her an appointment with a cardiologist.  Victoria Castro does not take aspirin.  I really told her strongly that she needed to be on aspirin.  One baby aspirin a day would be helpful for her.  She has had no cough.  She has had no nausea or vomiting.  There have been no fevers, sweats, or chills.  There has been no bony pain.  She has had no change in bowel or bladder habits.  There has been no leg swelling.  When we last checked her vitamin D level year ago, it was down to 11.  I am not sure if she is taking any vitamin D or not.  It is on her on med list, so she should be taking it.  PHYSICAL EXAMINATION:  General:  This is an elderly, petite white female in no obvious distress.  Vital signs:  Temperature of 97.9, pulse 89, respiratory rate 14, blood pressure 139/62.  Weight is 131 pounds.  Head and neck:  Normocephalic, atraumatic skull.  There are no ocular or oral lesions.  There are no palpable cervical or supraclavicular lymph nodes. Lungs:  Clear bilaterally.  Cardiac:  Regular rate and rhythm with a normal S1 and S2.  There are no murmurs, rubs, or bruits.  Abdomen: Soft.  She has good bowel sounds.  There is no fluid wave.  There is no palpable abdominal mass.  There is no palpable hepatosplenomegaly. Breasts:  Left breast with no masses, edema, or erythema.  There is  no left axillary adenopathy.  Right breast shows well-healed lumpectomy. She has a lumpectomy scar at the 10 o'clock position.  No obvious mass is noted in the right breast.  There is no right axillary adenopathy. Back:  Some slight kyphosis.  There is no tenderness over the spine, ribs, or hips.  Extremities:  No clubbing, cyanosis, or edema. Neurological:  No focal neurological deficits.  LABORATORY STUDIES:  White cell count is 8.5, hemoglobin 15.4, hematocrit 45.9, platelet count 221.  IMPRESSION:  Victoria Castro is a 72 year old white female with a history of stage I lung cancer and stage I breast cancer.  I really believe that she is cured of these.  It has been 7 years with respect to her lung cancer.  It has been 12 years since she had the breast cancer.  We will go ahead and plan for another yearly followup.  Hopefully, we will see that her vitamin D levels are better.  We will see about another CT scan next year.    ______________________________ Josph Macho, M.D. PRE/MEDQ  D:  02/22/2013  T:  02/23/2013  Job:  [id number]

## 2014-02-20 ENCOUNTER — Other Ambulatory Visit: Payer: Self-pay | Admitting: *Deleted

## 2014-02-20 DIAGNOSIS — C3491 Malignant neoplasm of unspecified part of right bronchus or lung: Secondary | ICD-10-CM

## 2014-02-21 ENCOUNTER — Encounter: Payer: Self-pay | Admitting: Hematology & Oncology

## 2014-02-21 ENCOUNTER — Ambulatory Visit (HOSPITAL_BASED_OUTPATIENT_CLINIC_OR_DEPARTMENT_OTHER): Payer: Medicare Other

## 2014-02-21 ENCOUNTER — Ambulatory Visit (HOSPITAL_BASED_OUTPATIENT_CLINIC_OR_DEPARTMENT_OTHER)
Admission: RE | Admit: 2014-02-21 | Discharge: 2014-02-21 | Disposition: A | Discharge status: Home / Self Care | Payer: Medicare Other | Source: Ambulatory Visit | Attending: Hematology & Oncology | Admitting: Hematology & Oncology

## 2014-02-21 ENCOUNTER — Other Ambulatory Visit (HOSPITAL_BASED_OUTPATIENT_CLINIC_OR_DEPARTMENT_OTHER): Payer: Medicare Other | Admitting: Lab

## 2014-02-21 ENCOUNTER — Ambulatory Visit (HOSPITAL_BASED_OUTPATIENT_CLINIC_OR_DEPARTMENT_OTHER): Payer: Medicare Other | Admitting: Hematology & Oncology

## 2014-02-21 VITALS — BP 151/72 | HR 90 | Temp 97.9°F | Resp 14 | Ht 63.0 in | Wt 124.0 lb

## 2014-02-21 DIAGNOSIS — Z853 Personal history of malignant neoplasm of breast: Secondary | ICD-10-CM

## 2014-02-21 DIAGNOSIS — C3491 Malignant neoplasm of unspecified part of right bronchus or lung: Secondary | ICD-10-CM

## 2014-02-21 DIAGNOSIS — Z85118 Personal history of other malignant neoplasm of bronchus and lung: Secondary | ICD-10-CM

## 2014-02-21 DIAGNOSIS — C3411 Malignant neoplasm of upper lobe, right bronchus or lung: Secondary | ICD-10-CM

## 2014-02-21 DIAGNOSIS — C3410 Malignant neoplasm of upper lobe, unspecified bronchus or lung: Secondary | ICD-10-CM

## 2014-02-21 DIAGNOSIS — C50911 Malignant neoplasm of unspecified site of right female breast: Secondary | ICD-10-CM | POA: Diagnosis not present

## 2014-02-21 DIAGNOSIS — Z23 Encounter for immunization: Secondary | ICD-10-CM

## 2014-02-21 LAB — CBC WITH DIFFERENTIAL (CANCER CENTER ONLY)
BASO#: 0 10*3/uL (ref 0.0–0.2)
BASO%: 0.3 % (ref 0.0–2.0)
EOS%: 0.8 % (ref 0.0–7.0)
Eosinophils Absolute: 0.1 10*3/uL (ref 0.0–0.5)
HCT: 46.4 % (ref 34.8–46.6)
HGB: 15.5 g/dL (ref 11.6–15.9)
LYMPH#: 2.4 10*3/uL (ref 0.9–3.3)
LYMPH%: 31.2 % (ref 14.0–48.0)
MCH: 29.2 pg (ref 26.0–34.0)
MCHC: 33.4 g/dL (ref 32.0–36.0)
MCV: 88 fL (ref 81–101)
MONO#: 0.7 10*3/uL (ref 0.1–0.9)
MONO%: 9.3 % (ref 0.0–13.0)
NEUT%: 58.4 % (ref 39.6–80.0)
NEUTROS ABS: 4.5 10*3/uL (ref 1.5–6.5)
PLATELETS: 204 10*3/uL (ref 145–400)
RBC: 5.3 10*6/uL (ref 3.70–5.32)
RDW: 14 % (ref 11.1–15.7)
WBC: 7.6 10*3/uL (ref 3.9–10.0)

## 2014-02-21 LAB — CMP (CANCER CENTER ONLY)
ALT(SGPT): 20 U/L (ref 10–47)
AST: 18 U/L (ref 11–38)
Albumin: 3.8 g/dL (ref 3.3–5.5)
Alkaline Phosphatase: 54 U/L (ref 26–84)
BILIRUBIN TOTAL: 0.6 mg/dL (ref 0.20–1.60)
BUN: 18 mg/dL (ref 7–22)
CHLORIDE: 100 meq/L (ref 98–108)
CO2: 28 mEq/L (ref 18–33)
CREATININE: 0.6 mg/dL (ref 0.6–1.2)
Calcium: 9.8 mg/dL (ref 8.0–10.3)
GLUCOSE: 94 mg/dL (ref 73–118)
POTASSIUM: 4 meq/L (ref 3.3–4.7)
SODIUM: 141 meq/L (ref 128–145)
Total Protein: 7 g/dL (ref 6.4–8.1)

## 2014-02-21 MED ORDER — INFLUENZA VAC SPLIT QUAD 0.5 ML IM SUSY
0.5000 mL | PREFILLED_SYRINGE | Freq: Once | INTRAMUSCULAR | Status: AC
  Administered 2014-02-21: 0.5 mL via INTRAMUSCULAR
  Filled 2014-02-21: qty 0.5

## 2014-02-21 NOTE — Patient Instructions (Signed)

## 2014-02-21 NOTE — Progress Notes (Signed)
Hematology and Oncology Follow Up Visit  Victoria Castro [id number] [birth year] 73 y.o. 02/21/2014   Principle Diagnosis:  1. 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 2. 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  Current Therapy:    Observation     Interim History:  Ms.  Castro is back for her yearly followup. Since we last saw her, she is doing quite well. She be going to [facility] for vacation to see a son. She be going in a week or so. She be other for about a week.  We did go ahead and do a CT scan on her. This is of her chest. The CT scan did not show any evidence of recurrent disease.  She's had no cough. There is no shortness of breath. She's had no chest wall pain. She had no abdominal pain. Her outside has been good. She's had no change in bowel or bladder habits. There's been no leg swelling. She's had no joint issues.  There's been no bleeding. She's had no weight loss or weight gain. She's had no headache. There's been no mouth sores or swallowing problems.  Medications: Current outpatient prescriptions:amLODipine-benazepril (LOTREL) 5-20 MG per capsule, Take 1 capsule by mouth daily., Disp: , Rfl: ;  Cholecalciferol (VITAMIN D) 2000 UNITS tablet, Take 2,000 Units by mouth daily., Disp: , Rfl: ;  atorvastatin (LIPITOR) 10 MG tablet, Take 10 mg by mouth daily., Disp: , Rfl:   Allergies: No Known Allergies  Past Medical History, Surgical history, Social history, and Family History were reviewed and updated.  Review of Systems: As above  Physical Exam:  height is 5\' 3"  (1.6 m) and weight is 124 lb (56.246 kg). Her oral temperature is 97.9 F (36.6 C). Her blood pressure is 151/72 and her pulse is 90. Her respiration is 14.   Elderly, petite white female in no obvious distress. Vital signs are above. Head and neck exam shows no ocular or oral lesions. She is no palpable cervical or supraclavicular lymph nodes. Lungs are clear. She does arouse,  wheezes or rhonchi. Cardiac exam regular in rhythm with no murmurs, rubs or bruits. Breast exam shows left breast with no masses, edema or erythema. There is no left axillary adenopathy. Right breast shows well-healed lumpectomy at the 10:00 position There might be some slight contraction of the right breast. No distinct masses noted in the right breast. There is no right axillary adenopathy. Abdomen is soft. She has good bowel sounds. There is no fluid wave. There is a palpable liver or spleen tip. Back exam shows no tenderness over the spine, ribs or hips. Her thoracoscopy scars are well-healed on the right lateral chest wall. Extremities shows no clubbing, cyanosis or edema. Neurological exam is nonfocal. Skin exam shows no rashes, ecchymosis or petechia.  Lab Results  Component Value Date   WBC 7.6 02/21/2014   HGB 15.5 02/21/2014   HCT 46.4 02/21/2014   MCV 88 02/21/2014   PLT 204 02/21/2014     Chemistry      Component Value Date/Time   NA 141 02/21/2014 0902   NA 145 01/22/2010 1022   K 4.0 02/21/2014 0902   K 3.9 01/22/2010 1022   CL 100 02/21/2014 0902   CL 106 01/22/2010 1022   CO2 28 02/21/2014 0902   CO2 27 01/22/2010 1022   BUN 18 02/21/2014 0902   BUN 19 01/22/2010 1022   CREATININE 0.6 02/21/2014 0902   CREATININE  0.57 01/22/2010 1022      Component Value Date/Time   CALCIUM 9.8 02/21/2014 0902   CALCIUM 9.9 01/22/2010 1022   ALKPHOS 54 02/21/2014 0902   ALKPHOS 74 01/22/2010 1022   AST 18 02/21/2014 0902   AST 15 01/22/2010 1022   ALT 20 02/21/2014 0902   ALT 14 01/22/2010 1022   BILITOT 0.60 02/21/2014 0902   BILITOT 0.4 01/22/2010 1022         Impression and Plan: Victoria Castro is 73 year old female with 2 separate primaries. She is both a stage I right lung cancer. She states 2 adenocarcinoma of the right breast. I think she is cured of both of these. The breast cancer was 13 years ago. The lung cancer  was 8 years ago.  I think at this point, we can probably just do a chest x-ray on her.  We  will get her back in one year.   Volanda Napoleon, MD 10/7/20155:38 PM

## 2014-02-22 LAB — VITAMIN D 25 HYDROXY (VIT D DEFICIENCY, FRACTURES): VIT D 25 HYDROXY: 24 ng/mL — AB (ref 30–89)

## 2014-02-23 ENCOUNTER — Telehealth: Payer: Self-pay | Admitting: Nurse Practitioner

## 2014-02-23 NOTE — Telephone Encounter (Addendum)
Message copied by Jimmy Footman on Fri Feb 23, 2014  9:57 AM ------      Message from: Burney Gauze R      Created: Thu Feb 22, 2014  6:09 PM       Please call. Her vitamin D level is low. She really needs to be on 2000 units a day of vitamin D. This will decrease her risk of cancer coming back. Pete ------Pt verbalized understanding and appreciation.

## 2014-04-09 ENCOUNTER — Ambulatory Visit: Payer: Self-pay | Admitting: Physician Assistant

## 2015-02-20 ENCOUNTER — Ambulatory Visit (HOSPITAL_BASED_OUTPATIENT_CLINIC_OR_DEPARTMENT_OTHER)
Admission: RE | Admit: 2015-02-20 | Discharge: 2015-02-20 | Disposition: A | Discharge status: Home / Self Care | Payer: Medicare Other | Source: Ambulatory Visit | Attending: Hematology & Oncology | Admitting: Hematology & Oncology

## 2015-02-20 ENCOUNTER — Encounter: Payer: Self-pay | Admitting: Hematology & Oncology

## 2015-02-20 ENCOUNTER — Ambulatory Visit (HOSPITAL_BASED_OUTPATIENT_CLINIC_OR_DEPARTMENT_OTHER): Payer: Medicare Other | Admitting: Hematology & Oncology

## 2015-02-20 ENCOUNTER — Other Ambulatory Visit (HOSPITAL_BASED_OUTPATIENT_CLINIC_OR_DEPARTMENT_OTHER): Payer: Medicare Other

## 2015-02-20 ENCOUNTER — Ambulatory Visit (HOSPITAL_BASED_OUTPATIENT_CLINIC_OR_DEPARTMENT_OTHER): Payer: Medicare Other

## 2015-02-20 VITALS — BP 145/67 | HR 89 | Temp 97.5°F | Resp 16 | Ht 63.0 in | Wt 126.0 lb

## 2015-02-20 DIAGNOSIS — Z86711 Personal history of pulmonary embolism: Secondary | ICD-10-CM

## 2015-02-20 DIAGNOSIS — C3411 Malignant neoplasm of upper lobe, right bronchus or lung: Secondary | ICD-10-CM

## 2015-02-20 DIAGNOSIS — Z23 Encounter for immunization: Secondary | ICD-10-CM

## 2015-02-20 DIAGNOSIS — Z853 Personal history of malignant neoplasm of breast: Secondary | ICD-10-CM

## 2015-02-20 DIAGNOSIS — C3431 Malignant neoplasm of lower lobe, right bronchus or lung: Secondary | ICD-10-CM

## 2015-02-20 DIAGNOSIS — Z85118 Personal history of other malignant neoplasm of bronchus and lung: Secondary | ICD-10-CM | POA: Diagnosis not present

## 2015-02-20 DIAGNOSIS — E559 Vitamin D deficiency, unspecified: Secondary | ICD-10-CM

## 2015-02-20 LAB — COMPREHENSIVE METABOLIC PANEL (CC13)
ALBUMIN: 4 g/dL (ref 3.5–5.0)
ALK PHOS: 67 U/L (ref 40–150)
ALT: 15 U/L (ref 0–55)
AST: 14 U/L (ref 5–34)
Anion Gap: 7 mEq/L (ref 3–11)
BUN: 16.7 mg/dL (ref 7.0–26.0)
CALCIUM: 10 mg/dL (ref 8.4–10.4)
CO2: 28 mEq/L (ref 22–29)
CREATININE: 0.7 mg/dL (ref 0.6–1.1)
Chloride: 107 mEq/L (ref 98–109)
EGFR: 83 mL/min/{1.73_m2} — ABNORMAL LOW (ref >90)
GLUCOSE: 126 mg/dL (ref 70–140)
Potassium: 4 mEq/L (ref 3.5–5.1)
Sodium: 141 mEq/L (ref 136–145)
Total Bilirubin: 0.5 mg/dL (ref 0.20–1.20)
Total Protein: 6.9 g/dL (ref 6.4–8.3)

## 2015-02-20 LAB — CBC WITH DIFFERENTIAL (CANCER CENTER ONLY)
BASO#: 0 10*3/uL (ref 0.0–0.2)
BASO%: 0.2 % (ref 0.0–2.0)
EOS%: 0.7 % (ref 0.0–7.0)
Eosinophils Absolute: 0.1 10*3/uL (ref 0.0–0.5)
HEMATOCRIT: 46.2 % (ref 34.8–46.6)
HEMOGLOBIN: 15.4 g/dL (ref 11.6–15.9)
LYMPH#: 2.4 10*3/uL (ref 0.9–3.3)
LYMPH%: 28.7 % (ref 14.0–48.0)
MCH: 28.8 pg (ref 26.0–34.0)
MCHC: 33.3 g/dL (ref 32.0–36.0)
MCV: 87 fL (ref 81–101)
MONO#: 0.7 10*3/uL (ref 0.1–0.9)
MONO%: 8 % (ref 0.0–13.0)
NEUT%: 62.4 % (ref 39.6–80.0)
NEUTROS ABS: 5.3 10*3/uL (ref 1.5–6.5)
Platelets: 193 10*3/uL (ref 145–400)
RBC: 5.34 10*6/uL — ABNORMAL HIGH (ref 3.70–5.32)
RDW: 13.7 % (ref 11.1–15.7)
WBC: 8.4 10*3/uL (ref 3.9–10.0)

## 2015-02-20 MED ORDER — INFLUENZA VAC SPLIT QUAD 0.5 ML IM SUSY
0.5000 mL | PREFILLED_SYRINGE | Freq: Once | INTRAMUSCULAR | Status: AC
Start: 2015-02-20 — End: 2015-02-20
  Administered 2015-02-20: 0.5 mL via INTRAMUSCULAR
  Filled 2015-02-20: qty 0.5

## 2015-02-20 NOTE — Progress Notes (Signed)
Hematology and Oncology Follow Up Visit  Victoria Castro [id number] 1940/12/18 74 y.o. 02/20/2015   Principle Diagnosis:  1. 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 2. 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  Current Therapy:    Observation     Interim History:  Ms.  Dearcos is back for her yearly followup. Since we last saw her, she is doing quite well.   She had a good time in [place]. Her son lives there. She really enjoyed visiting him.  We did do a chest x-ray on her today. Chest x-ray showed no evidence of recurrent or metastatic lung cancer.  Her appetite is good. She's had no nausea or vomiting. There's been no change in bowel or bladder habits. She's had no leg swelling. She's had no rashes. She's had no weight loss or waking. She's had no increasing fatigue.  Overall, her performance status is ECOG 1.    Medications:  Current outpatient prescriptions:  .  amLODipine-benazepril (LOTREL) 5-20 MG per capsule, Take 1 capsule by mouth daily., Disp: , Rfl:  .  Cholecalciferol (VITAMIN D) 2000 UNITS tablet, Take 2,000 Units by mouth daily., Disp: , Rfl:  .  pravastatin (PRAVACHOL) 10 MG tablet, Take by mouth., Disp: , Rfl:   Current facility-administered medications:  .  Influenza vac split quadrivalent PF (FLUARIX) injection 0.5 mL, 0.5 mL, Intramuscular, Once, Volanda Napoleon, MD  Allergies: No Known Allergies  Past Medical History, Surgical history, Social history, and Family History were reviewed and updated.  Review of Systems: As above  Physical Exam:  height is '5\' 3"'$  (1.6 m) and weight is 126 lb (57.153 kg). Her oral temperature is 97.5 F (36.4 C). Her blood pressure is 145/67 and her pulse is 89. Her respiration is 16.   Elderly, petite white female in no obvious distress. Vital signs are above. Head and neck exam shows no ocular or oral lesions. She is no palpable cervical or supraclavicular lymph nodes. Lungs are clear. She  does arouse, wheezes or rhonchi. Cardiac exam regular in rhythm with no murmurs, rubs or bruits. Breast exam shows left breast with no masses, edema or erythema. There is no left axillary adenopathy. Right breast shows well-healed lumpectomy at the 10:00 position There might be some slight contraction of the right breast. No distinct masses noted in the right breast. There is no right axillary adenopathy. Abdomen is soft. She has good bowel sounds. There is no fluid wave. There is a palpable liver or spleen tip. Back exam shows no tenderness over the spine, ribs or hips. Her thoracoscopy scars are well-healed on the right lateral chest wall. Extremities shows no clubbing, cyanosis or edema. Neurological exam is nonfocal. Skin exam shows no rashes, ecchymosis or petechia.  Lab Results  Component Value Date   WBC 8.4 02/20/2015   HGB 15.4 02/20/2015   HCT 46.2 02/20/2015   MCV 87 02/20/2015   PLT 193 02/20/2015     Chemistry      Component Value Date/Time   NA 141 02/21/2014 0902   NA 145 01/22/2010 1022   K 4.0 02/21/2014 0902   K 3.9 01/22/2010 1022   CL 100 02/21/2014 0902   CL 106 01/22/2010 1022   CO2 28 02/21/2014 0902   CO2 27 01/22/2010 1022   BUN 18 02/21/2014 0902   BUN 19 01/22/2010 1022   CREATININE 0.6 02/21/2014 0902   CREATININE 0.57 01/22/2010 1022  Component Value Date/Time   CALCIUM 9.8 02/21/2014 0902   CALCIUM 9.9 01/22/2010 1022   ALKPHOS 54 02/21/2014 0902   ALKPHOS 74 01/22/2010 1022   AST 18 02/21/2014 0902   AST 15 01/22/2010 1022   ALT 20 02/21/2014 0902   ALT 14 01/22/2010 1022   BILITOT 0.60 02/21/2014 0902   BILITOT 0.4 01/22/2010 1022         Impression and Plan: Ms. Boyan is 74 year old female with 2 separate primaries. She has both a stage I right lung cancer. She had a Stage IIA (T2N0M0) adenocarcinoma of the right breast. I think she is cured of both of these. The breast cancer was 14 years ago. The lung cancer  was 9 years ago.  I  think at this point, we can probably just do a chest x-ray on her.  We will get her back in one year.   Volanda Napoleon, MD 10/5/201611:14 AM

## 2015-02-20 NOTE — Patient Instructions (Signed)

## 2015-02-21 ENCOUNTER — Telehealth: Payer: Self-pay | Admitting: *Deleted

## 2015-02-21 DIAGNOSIS — E559 Vitamin D deficiency, unspecified: Secondary | ICD-10-CM

## 2015-02-21 LAB — VITAMIN D 25 HYDROXY (VIT D DEFICIENCY, FRACTURES): VIT D 25 HYDROXY: 27 ng/mL — AB (ref 30–100)

## 2015-02-21 MED ORDER — ERGOCALCIFEROL 1.25 MG (50000 UT) PO CAPS: 50000.0000 [IU] | ORAL_CAPSULE | ORAL | Status: DC

## 2015-02-21 NOTE — Telephone Encounter (Addendum)
Patient aware of lab results and new prescription. Prescription sent in.   ----- Message from Volanda Napoleon, MD sent at 02/21/2015  7:23 AM EDT ----- Call - vit D is low!! She needs to take 50000 units a week.  Please call this in for her!! Thanks! Laurey Arrow

## 2015-11-06 ENCOUNTER — Telehealth: Payer: Self-pay | Admitting: *Deleted

## 2015-11-06 NOTE — Telephone Encounter (Signed)
On 11-06-15 fax medical records to [facility] attn bridgette ti was consult note, end of tx note, sim and planning note, follow up note, dos.

## 2015-11-14 ENCOUNTER — Telehealth: Payer: Self-pay | Admitting: Oncology

## 2015-11-14 NOTE — Telephone Encounter (Signed)
Lt mess for pt regarding appt for Dr Alen Blew. Contact Dr. Marin Olp regarding new melanoma clinic and pt's wanting to participate in clinic. [facility] records has been requested.

## 2015-12-10 ENCOUNTER — Ambulatory Visit (HOSPITAL_BASED_OUTPATIENT_CLINIC_OR_DEPARTMENT_OTHER): Payer: Medicare Other | Admitting: Oncology

## 2015-12-10 VITALS — BP 183/80 | HR 97 | Temp 97.9°F | Resp 20 | Ht 63.0 in | Wt 121.5 lb

## 2015-12-10 DIAGNOSIS — C439 Malignant melanoma of skin, unspecified: Secondary | ICD-10-CM

## 2015-12-10 DIAGNOSIS — Z85118 Personal history of other malignant neoplasm of bronchus and lung: Secondary | ICD-10-CM

## 2015-12-10 DIAGNOSIS — Z853 Personal history of malignant neoplasm of breast: Secondary | ICD-10-CM

## 2015-12-10 DIAGNOSIS — C430 Malignant melanoma of lip: Secondary | ICD-10-CM

## 2015-12-10 NOTE — Progress Notes (Signed)
Reason for Referral: Melanoma.   HPI: 75 year old woman referred to me for the evaluation for stage III melanoma. She is a pleasant healthy woman with history of breast cancer as well as lung cancer that have been treated remotely. She was diagnosed with breast cancer in 1995 and a lung cancer in 2004 and remained disease-free from that standpoint. She had recurrent nonmelanoma skin cancer that have been resected in the past. She was noticed to have a lesion on the right upper lip and and underwent a biopsy which showed a malignant melanoma in situ. She underwent Mohs procedure in February 2017 which did not show any residual malignancy at that time. She subsequently started developing a tender mass in her right submandibular region which did not improve with antibiotics. Evaluate her aspiration done on 09/16/2015 showed atypical cells suspicious for malignancy. PET scan showed right-sided lymphadenopathy at the level of IIa. She subsequently underwent a repeat biopsy on 10/02/2015 which confirmed the presence of metastatic melanoma.  She is status post neck dissection performed on 10/22/2015 which showed 2 out of 7 lymph nodes involved with metastatic melanoma. Her most recent PET scan was on 09/26/2015 which should not show any metastatic disease. She was evaluated by oncology at [facility]. And recommended adjuvant therapy ipilimumab at a reduced doses. Clinically, she is asymptomatic at this time and reports no residual complications. She have regained all activities of daily living and continues to live independently. She denied any neck pain or discomfort. She denied any constitutional symptoms.  She is not report any headaches, blurry vision, syncope or seizures. She does not report any fevers or chills or sweats. She does not report any cough, wheezing or hemoptysis. She does not report any chest pain, palpitation, orthopnea or leg edema. She does not report any frequency urgency or hesitancy.  She does not report any skeletal complaints. Remaining review of systems unremarkable.    her past medical history significant for hypertension, breast cancer and lung malignancy.    Current Outpatient Prescriptions:  .  amLODipine-benazepril (LOTREL) 5-20 MG per capsule, Take 1 capsule by mouth daily., Disp: , Rfl:  .  Cholecalciferol (VITAMIN D) 2000 UNITS tablet, Take 2,000 Units by mouth daily., Disp: , Rfl:  .  pravastatin (PRAVACHOL) 10 MG tablet, Take by mouth., Disp: , Rfl: :  No Known Allergies:  No family history on file.:  Social History   Social History  . Marital status: Widowed    Spouse name: N/A  . Number of children: N/A  . Years of education: N/A   Occupational History  . Not on file.   Social History Main Topics  . Smoking status: Former Smoker    Packs/day: 0.50    Years: 18.00    Types: Cigarettes    Start date: 09/21/1956    Quit date: 02/21/1973  . Smokeless tobacco: Never Used     Comment: quit 40 years ago  . Alcohol use Not on file  . Drug use: Unknown  . Sexual activity: Not on file   Other Topics Concern  . Not on file   Social History Narrative  . No narrative on file  :  Pertinent items are noted in HPI.  Exam: Blood pressure (!) 183/80, pulse 97, temperature 97.9 F (36.6 C), resp. rate 20, height '5\' 3"'$  (1.6 m), weight 121 lb 8 oz (55.1 kg), SpO2 100 %. General appearance: alert and cooperativeAppeared without distress. Head: Normocephalic, without obvious abnormality Throat: lips, mucosa, and tongue normal; teeth  and gums normal no oral thrush. Neck: no adenopathy. A well-healed scar without adenopathy noted on the right side. Back: negative Resp: clear to auscultation bilaterally Chest wall: no tenderness Cardio: regular rate and rhythm, S1, S2 normal, no murmur, click, rub or gallop GI: soft, non-tender; bowel sounds normal; no masses,  no organomegaly Extremities: extremities normal, atraumatic, no cyanosis or edema Pulses:  2+ and symmetric   CBC    Component Value Date/Time   WBC 8.4 02/20/2015 0958   WBC 7.5 01/13/2007 0825   RBC 5.34 (H) 02/20/2015 0958   RBC 5.26 01/13/2007 0825   HGB 15.4 02/20/2015 0958   HGB 15.4 01/13/2007 0825   HCT 46.2 02/20/2015 0958   HCT 44.1 01/13/2007 0825   PLT 193 02/20/2015 0958   PLT 206 01/13/2007 0825   MCV 87 02/20/2015 0958   MCV 83.8 01/13/2007 0825   MCH 28.8 02/20/2015 0958   MCHC 33.3 02/20/2015 0958   MCHC 34.9 01/13/2007 0825   RDW 13.7 02/20/2015 0958   RDW 13.6 01/13/2007 0825   LYMPHSABS 2.4 02/20/2015 0958   LYMPHSABS 1.9 01/13/2007 0825   MONOABS 0.5 01/13/2007 0825   EOSABS 0.1 02/20/2015 0958   BASOSABS 0.0 02/20/2015 0958   BASOSABS 0.0 01/13/2007 0825      Assessment and Plan:   75 year old woman with the following issues:  1. 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 She was initially found to have a lesion on the right upper lip and subsequently developed cervical adenopathy with the final pathological staging as IIIB ([id number]). She is status post Mohs procedure followed by neck dissection completed in June 2017.  The rationale for using adjuvant therapy in this particular setting was discussed. She understands that she has high risk of recurrence with metastatic disease. Options of adjuvant therapy were reviewed which includes interferon high-dose versus ipilimumab. Both these options are fairly toxic and it would cause a lot of morbidity especially at the ipilimumab 10 mg/kg dose. The option of treating her at a lower dose of 3 mg/kg dose was reviewed. The rationale for using the lower dose and the recent clinical trial suggesting that the lower dose is certainly more tolerable and likely equally effective.  Complications associated with this medication and infusion were reviewed. Immune related toxicities were discussed today in detail. These would include colitis, pneumonitis, thyroiditis, and others. Symptoms  such as fatigue, dyspnea, diarrhea will be early signs of these complications. Dose interruptions and delay our possible as well as the use of prednisone. Severe morbidity and death is possible but less likely would lower doses.  She understands without adjuvant systemic therapy she is at a higher risk of relapse as well.  I also gave her the option to follow-up with Dr. Marin Olp to receive this therapy under his care which she will consider as well.  After discussion today she will think about it and let me know in the near future.  2. History of breast malignancy: She is over 10 years out and appears to be in remission. She continues to follow with Dr. Marin Olp periodically regarding that.  3. The lung malignancy: Remote at this time appears to be cured.  All her questions were answered today to her satisfaction.

## 2015-12-12 ENCOUNTER — Telehealth: Payer: Self-pay | Admitting: *Deleted

## 2015-12-12 ENCOUNTER — Ambulatory Visit: Payer: Medicare Other | Admitting: Oncology

## 2015-12-12 DIAGNOSIS — C439 Malignant melanoma of skin, unspecified: Secondary | ICD-10-CM | POA: Insufficient documentation

## 2015-12-12 NOTE — Telephone Encounter (Signed)
Spoke with patient, she has decided to take the infusions that dr Alen Blew suggested at her new patient appointment. Note to dr Hazeline Junker desk

## 2015-12-12 NOTE — Addendum Note (Signed)
Addended by: Wyatt Portela on: 12/12/2015 05:08 PM   Modules accepted: Orders

## 2015-12-12 NOTE — Telephone Encounter (Signed)
Please let her know that she will be contacted for her infusion schedule.

## 2015-12-13 ENCOUNTER — Telehealth: Payer: Self-pay | Admitting: Oncology

## 2015-12-13 NOTE — Telephone Encounter (Signed)
pt daughter called to confirm apts, apts confirmed

## 2015-12-13 NOTE — Telephone Encounter (Signed)
left message confirming apt dates & times & mailed a copy

## 2015-12-19 ENCOUNTER — Other Ambulatory Visit: Payer: Medicare Other

## 2016-01-08 ENCOUNTER — Other Ambulatory Visit: Payer: Medicare Other

## 2016-01-13 ENCOUNTER — Other Ambulatory Visit: Payer: Self-pay | Admitting: Pharmacist

## 2016-01-13 DIAGNOSIS — C439 Malignant melanoma of skin, unspecified: Secondary | ICD-10-CM

## 2016-01-14 ENCOUNTER — Telehealth: Payer: Self-pay | Admitting: Oncology

## 2016-01-14 ENCOUNTER — Other Ambulatory Visit (HOSPITAL_BASED_OUTPATIENT_CLINIC_OR_DEPARTMENT_OTHER): Payer: Medicare Other

## 2016-01-14 ENCOUNTER — Ambulatory Visit (HOSPITAL_BASED_OUTPATIENT_CLINIC_OR_DEPARTMENT_OTHER): Payer: Medicare Other | Admitting: Oncology

## 2016-01-14 ENCOUNTER — Ambulatory Visit (HOSPITAL_BASED_OUTPATIENT_CLINIC_OR_DEPARTMENT_OTHER): Payer: Medicare Other

## 2016-01-14 VITALS — BP 168/87 | HR 90 | Resp 18

## 2016-01-14 VITALS — BP 167/98 | HR 92 | Temp 98.1°F | Resp 18 | Ht 63.0 in | Wt 122.4 lb

## 2016-01-14 DIAGNOSIS — Z5112 Encounter for antineoplastic immunotherapy: Secondary | ICD-10-CM | POA: Diagnosis not present

## 2016-01-14 DIAGNOSIS — R911 Solitary pulmonary nodule: Secondary | ICD-10-CM | POA: Diagnosis not present

## 2016-01-14 DIAGNOSIS — C439 Malignant melanoma of skin, unspecified: Secondary | ICD-10-CM

## 2016-01-14 DIAGNOSIS — C770 Secondary and unspecified malignant neoplasm of lymph nodes of head, face and neck: Secondary | ICD-10-CM

## 2016-01-14 DIAGNOSIS — Z853 Personal history of malignant neoplasm of breast: Secondary | ICD-10-CM

## 2016-01-14 DIAGNOSIS — C430 Malignant melanoma of lip: Secondary | ICD-10-CM | POA: Diagnosis not present

## 2016-01-14 DIAGNOSIS — Z79899 Other long term (current) drug therapy: Secondary | ICD-10-CM | POA: Diagnosis not present

## 2016-01-14 LAB — CBC WITH DIFFERENTIAL/PLATELET
BASO%: 0.3 % (ref 0.0–2.0)
Basophils Absolute: 0 10*3/uL (ref 0.0–0.1)
EOS%: 0.7 % (ref 0.0–7.0)
Eosinophils Absolute: 0.1 10*3/uL (ref 0.0–0.5)
HCT: 47.4 % — ABNORMAL HIGH (ref 34.8–46.6)
HEMOGLOBIN: 15.6 g/dL (ref 11.6–15.9)
LYMPH%: 27.4 % (ref 14.0–49.7)
MCH: 28.2 pg (ref 25.1–34.0)
MCHC: 32.9 g/dL (ref 31.5–36.0)
MCV: 85.7 fL (ref 79.5–101.0)
MONO#: 0.6 10*3/uL (ref 0.1–0.9)
MONO%: 8.1 % (ref 0.0–14.0)
NEUT%: 63.5 % (ref 38.4–76.8)
NEUTROS ABS: 4.3 10*3/uL (ref 1.5–6.5)
Platelets: 157 10*3/uL (ref 145–400)
RBC: 5.53 10*6/uL — ABNORMAL HIGH (ref 3.70–5.45)
RDW: 13.6 % (ref 11.2–14.5)
WBC: 6.8 10*3/uL (ref 3.9–10.3)
lymph#: 1.9 10*3/uL (ref 0.9–3.3)

## 2016-01-14 LAB — COMPREHENSIVE METABOLIC PANEL
ALBUMIN: 3.9 g/dL (ref 3.5–5.0)
ALK PHOS: 77 U/L (ref 40–150)
ALT: 17 U/L (ref 0–55)
AST: 18 U/L (ref 5–34)
Anion Gap: 11 mEq/L (ref 3–11)
BILIRUBIN TOTAL: 0.3 mg/dL (ref 0.20–1.20)
BUN: 14.5 mg/dL (ref 7.0–26.0)
CO2: 24 mEq/L (ref 22–29)
Calcium: 10.6 mg/dL — ABNORMAL HIGH (ref 8.4–10.4)
Chloride: 107 mEq/L (ref 98–109)
Creatinine: 0.7 mg/dL (ref 0.6–1.1)
EGFR: 85 mL/min/{1.73_m2} — ABNORMAL LOW (ref >90)
GLUCOSE: 84 mg/dL (ref 70–140)
Potassium: 4.1 mEq/L (ref 3.5–5.1)
SODIUM: 143 meq/L (ref 136–145)
TOTAL PROTEIN: 7.6 g/dL (ref 6.4–8.3)

## 2016-01-14 LAB — TSH: TSH: 10.952 m(IU)/L — ABNORMAL HIGH (ref 0.308–3.960)

## 2016-01-14 MED ORDER — SODIUM CHLORIDE 0.9 % IV SOLN
Freq: Once | INTRAVENOUS | Status: AC
  Administered 2016-01-14: 11:00:00 via INTRAVENOUS

## 2016-01-14 MED ORDER — SODIUM CHLORIDE 0.9 % IV SOLN
3.0000 mg/kg | Freq: Once | INTRAVENOUS | Status: AC
  Administered 2016-01-14: 165 mg via INTRAVENOUS
  Filled 2016-01-14: qty 33

## 2016-01-14 NOTE — Patient Instructions (Signed)
[facility] Discharge Instructions for Patients Receiving Chemotherapy  Today you received the following chemotherapy agents Yervoy  To help prevent nausea and vomiting after your treatment, we encourage you to take your nausea medication as directed.   If you develop nausea and vomiting that is not controlled by your nausea medication, call the clinic.   BELOW ARE SYMPTOMS THAT SHOULD BE REPORTED IMMEDIATELY:  *FEVER GREATER THAN 100.5 F  *CHILLS WITH OR WITHOUT FEVER  NAUSEA AND VOMITING THAT IS NOT CONTROLLED WITH YOUR NAUSEA MEDICATION  *UNUSUAL SHORTNESS OF BREATH  *UNUSUAL BRUISING OR BLEEDING  TENDERNESS IN MOUTH AND THROAT WITH OR WITHOUT PRESENCE OF ULCERS  *URINARY PROBLEMS  *BOWEL PROBLEMS  UNUSUAL RASH Items with * indicate a potential emergency and should be followed up as soon as possible.  Feel free to call the clinic you have any questions or concerns. The clinic phone number is [phone].  Please show the [facility] at check-in to the Emergency Department and triage nurse.   Ipilimumab injection What is this medicine? IPILIMUMAB (IP i LIM ue mab) is a monoclonal antibody. It is used to treat melanoma, a type of skin cancer. This medicine may be used for other purposes; ask your health care provider or pharmacist if you have questions. What should I tell my health care provider before I take this medicine? They need to know if you have any of these conditions: -Addison's disease -blood in your stools (black or tarry stools) or if you have blood in your vomit -eye disease, vision problems -history of pancreatitis -history of stomach bleeding -immune system problems -inflammatory bowel disease -kidney disease -liver disease -lupus -myasthenia gravis -organ transplant -rheumatoid arthritis -sarcoidosis -stomach or intestine problems -thyroid disease -tingling of the fingers or toes, or other nerve disorder -an unusual or  allergic reaction to ipilimumab, other medicines, foods, dyes, or preservatives -pregnant or trying to get pregnant -breast-feeding How should I use this medicine? This medicine is for infusion into a vein. It is given by a health care professional in a hospital or clinic setting. A special MedGuide will be given to you before each treatment. Be sure to read this information carefully each time. Talk to your pediatrician regarding the use of this medicine in children. Special care may be needed. Overdosage: If you think you have taken too much of this medicine contact a poison control center or emergency room at once. NOTE: This medicine is only for you. Do not share this medicine with others. What if I miss a dose? It is important not to miss your dose. Call your doctor or health care professional if you are unable to keep an appointment. What may interact with this medicine? Interactions are not expected. This list may not describe all possible interactions. Give your health care provider a list of all the medicines, herbs, non-prescription drugs, or dietary supplements you use. Also tell them if you smoke, drink alcohol, or use illegal drugs. Some items may interact with your medicine. What should I watch for while using this medicine? Tell your doctor or healthcare professional if your symptoms do not start to get better or if they get worse. Do not become pregnant while taking this medicine or for 3 months after stopping it. Women should inform their doctor if they wish to become pregnant or think they might be pregnant. There is a potential for serious side effects to an unborn child. Talk to your health care professional or pharmacist for more  information. Do not breast-feed an infant while taking this medicine or for 3 months after the last dose. Your condition will be monitored carefully while you are receiving this medicine. You may need blood work done while you are taking this  medicine. What side effects may I notice from receiving this medicine? Side effects that you should report to your doctor or health care professional as soon as possible: -allergic reactions like skin rash, itching or hives, swelling of the face, lips, or tongue -black, tarry stools -bloody or watery diarrhea -changes in vision -dizziness -eye pain -fast, irregular heartbeat -feeling anxious -feeling faint or lightheaded, falls -nausea, vomiting -pain, tingling, numbness in the hands or feet -redness, blistering, peeling or loosening of the skin, including inside the mouth -signs and symptoms of liver injury like dark yellow or brown urine; general ill feeling or flu-like symptoms; light-colored stools; loss of appetite; nausea; right upper belly pain; unusually weak or tired; yellowing of the eyes or skin -unusual bleeding or bruising Side effects that usually do not require medical attention (Report these to your doctor or health care professional if they continue or are bothersome.): -headache -loss of appetite -trouble sleeping This list may not describe all possible side effects. Call your doctor for medical advice about side effects. You may report side effects to FDA at 1-800-FDA-1088. Where should I keep my medicine? This drug is given in a hospital or clinic and will not be stored at home. NOTE: This sheet is a summary. It may not cover all possible information. If you have questions about this medicine, talk to your doctor, pharmacist, or health care provider.    2016, Elsevier/Gold Standard. (2014-07-03 17:10:32)

## 2016-01-14 NOTE — Progress Notes (Signed)
Hematology and Oncology Follow Up Visit  VIOLET SEABURY [id number] 10/06/1940 75 y.o. 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 She developed subsequently cervical adenopathy in June 2017. Her final pathological staging was IIIB (TisN2aM0.    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 She underwent Mohs procedure in February 2017 which did not show any residual malignancy at that time.  She subsequently started developing a tender mass in her right submandibular region which did not improve with antibiotics. Evaluate her aspiration done on 09/16/2015 showed atypical cells suspicious for malignancy.  PET scan showed right-sided lymphadenopathy at the level of IIa. She subsequently underwent a repeat biopsy on 10/02/2015 which confirmed the presence of metastatic melanoma. She is status post neck dissection performed on 10/22/2015 which showed 2 out of 7 lymph nodes involved with metastatic melanoma.  Current therapy: Adjuvant ipilimumab at reduced dose of 3 mg/kg every 3 weeks for a total of 4 doses.  Interim History: Mrs. Zachman presents today for a follow-up visit. She is a pleasant woman with the above history is as today for evaluation prior to her first treatment. Clinically she feels well and has no complaints. She denied any abdominal pain, diarrhea or respiratory symptoms. She continues to perform activities of daily living without any decline. She recently returned from a trip to [place] and was able to travel without any incident.  She is not report any headaches, blurry vision, syncope or seizures. She does not report any fevers or chills or sweats. She does not report any cough, wheezing or hemoptysis. She does not report any chest pain, palpitation, orthopnea or leg edema. She  does not report any frequency urgency or hesitancy. She does not report any skeletal complaints. Remaining review of systems unremarkable.   Medications: I have reviewed the patient's current medications.  Current Outpatient Prescriptions  Medication Sig Dispense Refill  . amLODipine-benazepril (LOTREL) 5-20 MG per capsule Take 1 capsule by mouth daily.    . Cholecalciferol (VITAMIN D) 2000 UNITS tablet Take 2,000 Units by mouth daily.     No current facility-administered medications for this visit.      Allergies: No Known Allergies  Past Medical History, Surgical history, Social history, and Family History were reviewed and updated.   Physical Exam: Blood pressure (!) 167/98, pulse 92, temperature 98.1 F (36.7 C), temperature source Oral, resp. rate 18, height '5\' 3"'$  (1.6 m), weight 122 lb 6.4 oz (55.5 kg), SpO2 100 %. ECOG: 0 General appearance: alert and cooperative Head: Normocephalic, without obvious abnormality Neck: no adenopathy Lymph nodes: Cervical, supraclavicular, and axillary nodes normal. Heart:regular rate and rhythm, S1, S2 normal, no murmur, click, rub or gallop Lung:chest clear, no wheezing, rales, normal symmetric air entry, Heart exam - S1, S2 normal, no murmur, no gallop, rate regular Abdomin: soft, non-tender, without masses or organomegaly EXT:no erythema, induration, or nodules   Lab Results: Lab Results  Component Value Date   WBC 6.8 01/14/2016   HGB 15.6 01/14/2016   HCT 47.4 (H) 01/14/2016   MCV 85.7 01/14/2016   PLT 157 01/14/2016     Chemistry      Component Value Date/Time   NA 141 02/20/2015 0959   K 4.0 02/20/2015 0959   CL 100 02/21/2014 0902   CO2 28 02/20/2015 0959   BUN 16.7  02/20/2015 0959   CREATININE 0.7 02/20/2015 0959      Component Value Date/Time   CALCIUM 10.0 02/20/2015 0959   ALKPHOS 67 02/20/2015 0959   AST 14 02/20/2015 0959   ALT 15 02/20/2015 0959   BILITOT 0.50 02/20/2015 0959       Impression and  Plan:  75 year old woman with the following issues:  1. 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 She was initially found to have a lesion on the right upper lip and subsequently developed cervical adenopathy with the final pathological staging as IIIB (TisN2aM0). She is status post Mohs procedure followed by neck dissection completed in June 2017.  After discussing the risks and benefits of adjuvant ipilimumab at a reduced dose she is agreeable to proceed. I reemphasized the importance of reporting signs or symptoms of immune-related side effects. These would include diarrhea, respiratory symptoms or rash. She will receive the first out of 4 treatments starting today.  2. History of breast cancer: She continues to follow with Dr. Marin Olp regarding this issue.  3. Pulmonary nodule: She is scheduled to have a repeat PET scan in September 2017 at [facility].  4. Follow-up: Will be in 3 weeks for cycle 2 of therapy.   [facility], MD 8/29/201710:13 AM

## 2016-01-14 NOTE — Telephone Encounter (Signed)
Appointments complete per 8/29 los. Patient to get schedule in inf area - relative aware.

## 2016-01-14 NOTE — Progress Notes (Signed)
Completed Yervoy RN checklist prior to initiation of treatment.  All responses were No.  Able to proceed with treatment.

## 2016-02-04 ENCOUNTER — Other Ambulatory Visit (HOSPITAL_BASED_OUTPATIENT_CLINIC_OR_DEPARTMENT_OTHER): Payer: Medicare Other

## 2016-02-04 ENCOUNTER — Ambulatory Visit (HOSPITAL_BASED_OUTPATIENT_CLINIC_OR_DEPARTMENT_OTHER): Payer: Medicare Other

## 2016-02-04 ENCOUNTER — Ambulatory Visit (HOSPITAL_BASED_OUTPATIENT_CLINIC_OR_DEPARTMENT_OTHER): Payer: Medicare Other | Admitting: Oncology

## 2016-02-04 VITALS — BP 134/83 | HR 92 | Temp 98.4°F | Resp 18 | Ht 63.0 in | Wt 123.4 lb

## 2016-02-04 DIAGNOSIS — C430 Malignant melanoma of lip: Secondary | ICD-10-CM | POA: Diagnosis not present

## 2016-02-04 DIAGNOSIS — Z853 Personal history of malignant neoplasm of breast: Secondary | ICD-10-CM | POA: Diagnosis not present

## 2016-02-04 DIAGNOSIS — C770 Secondary and unspecified malignant neoplasm of lymph nodes of head, face and neck: Secondary | ICD-10-CM

## 2016-02-04 DIAGNOSIS — Z5112 Encounter for antineoplastic immunotherapy: Secondary | ICD-10-CM

## 2016-02-04 DIAGNOSIS — I10 Essential (primary) hypertension: Secondary | ICD-10-CM | POA: Diagnosis not present

## 2016-02-04 DIAGNOSIS — R911 Solitary pulmonary nodule: Secondary | ICD-10-CM | POA: Diagnosis not present

## 2016-02-04 DIAGNOSIS — C439 Malignant melanoma of skin, unspecified: Secondary | ICD-10-CM

## 2016-02-04 LAB — COMPREHENSIVE METABOLIC PANEL
ALT: 16 U/L (ref 0–55)
ANION GAP: 11 meq/L (ref 3–11)
AST: 17 U/L (ref 5–34)
Albumin: 3.7 g/dL (ref 3.5–5.0)
Alkaline Phosphatase: 69 U/L (ref 40–150)
BUN: 14.9 mg/dL (ref 7.0–26.0)
CHLORIDE: 108 meq/L (ref 98–109)
CO2: 23 meq/L (ref 22–29)
CREATININE: 0.7 mg/dL (ref 0.6–1.1)
Calcium: 10.3 mg/dL (ref 8.4–10.4)
EGFR: 85 mL/min/{1.73_m2} — ABNORMAL LOW (ref >90)
Glucose: 96 mg/dl (ref 70–140)
POTASSIUM: 4.2 meq/L (ref 3.5–5.1)
Sodium: 142 mEq/L (ref 136–145)
Total Bilirubin: 0.33 mg/dL (ref 0.20–1.20)
Total Protein: 7.4 g/dL (ref 6.4–8.3)

## 2016-02-04 LAB — CBC WITH DIFFERENTIAL/PLATELET
BASO%: 0.3 % (ref 0.0–2.0)
Basophils Absolute: 0 10*3/uL (ref 0.0–0.1)
EOS ABS: 0.1 10*3/uL (ref 0.0–0.5)
EOS%: 1.4 % (ref 0.0–7.0)
HCT: 44.9 % (ref 34.8–46.6)
HGB: 14.9 g/dL (ref 11.6–15.9)
LYMPH%: 29.3 % (ref 14.0–49.7)
MCH: 28.4 pg (ref 25.1–34.0)
MCHC: 33.2 g/dL (ref 31.5–36.0)
MCV: 85.5 fL (ref 79.5–101.0)
MONO#: 0.8 10*3/uL (ref 0.1–0.9)
MONO%: 9.4 % (ref 0.0–14.0)
NEUT%: 59.6 % (ref 38.4–76.8)
NEUTROS ABS: 4.8 10*3/uL (ref 1.5–6.5)
PLATELETS: 176 10*3/uL (ref 145–400)
RBC: 5.25 10*6/uL (ref 3.70–5.45)
RDW: 14 % (ref 11.2–14.5)
WBC: 8 10*3/uL (ref 3.9–10.3)
lymph#: 2.3 10*3/uL (ref 0.9–3.3)
nRBC: 0 % (ref 0–0)

## 2016-02-04 LAB — TSH: TSH: 12.943 m[IU]/L — AB (ref 0.308–3.960)

## 2016-02-04 MED ORDER — IPILIMUMAB CHEMO INJECTION 200 MG/40ML
3.0000 mg/kg | Freq: Once | INTRAVENOUS | Status: AC
  Administered 2016-02-04: 165 mg via INTRAVENOUS
  Filled 2016-02-04: qty 33

## 2016-02-04 MED ORDER — SODIUM CHLORIDE 0.9 % IV SOLN
Freq: Once | INTRAVENOUS | Status: AC
  Administered 2016-02-04: 10:00:00 via INTRAVENOUS

## 2016-02-04 NOTE — Progress Notes (Signed)
Hematology and Oncology Follow Up Visit  Victoria Castro [id number] 1940/10/22 75 y.o. 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 She developed subsequently cervical adenopathy in June 2017. Her final pathological staging was IIIB (TisN2aM0.    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 She underwent Mohs procedure in February 2017 which did not show any residual malignancy at that time.  She subsequently started developing a tender mass in her right submandibular region which did not improve with antibiotics. Evaluate her aspiration done on 09/16/2015 showed atypical cells suspicious for malignancy.  PET scan showed right-sided lymphadenopathy at the level of IIa. She subsequently underwent a repeat biopsy on 10/02/2015 which confirmed the presence of metastatic melanoma. She is status post neck dissection performed on 10/22/2015 which showed 2 out of 7 lymph nodes involved with metastatic melanoma.  Current therapy: Adjuvant ipilimumab at reduced dose of 3 mg/kg every 3 weeks for a total of 4 doses. She is here for the second dose.  Interim History: [name] presents today for a follow-up visit. Since the last visit, she received her first dose of therapy without complications. She denied any nausea, abdominal pain or diarrhea. She denied any respiratory symptoms. She denied any skin rashes or lesions. She denied any neurological deficits.   She continues to be in excellent health and shape and enjoys excellent quality of life. She she is eating well and have gained more weight since last visit.  She is not report any headaches, blurry vision, syncope or seizures. She does not report any fevers or chills or sweats. She does not report any cough, wheezing or hemoptysis. She does not  report any chest pain, palpitation, orthopnea or leg edema. She does not report any frequency urgency or hesitancy. She does not report any skeletal complaints. Remaining review of systems unremarkable.   Medications: I have reviewed the patient's current medications.  Current Outpatient Prescriptions  Medication Sig Dispense Refill  . amLODipine-benazepril (LOTREL) 5-20 MG per capsule Take 1 capsule by mouth daily.    . Cholecalciferol (VITAMIN D) 2000 UNITS tablet Take 2,000 Units by mouth daily.     No current facility-administered medications for this visit.      Allergies: No Known Allergies  Past Medical History, Surgical history, Social history, and Family History were reviewed and updated.   Physical Exam: Blood pressure 134/83, pulse 92, temperature 98.4 F (36.9 C), temperature source Oral, resp. rate 18, height '5\' 3"'$  (1.6 m), weight 123 lb 6.4 oz (56 kg), SpO2 98 %. ECOG: 0 General appearance: alert and cooperative without distress. Head: Normocephalic, without obvious abnormality oral ulcers or lesions. Neck: no adenopathy Lymph nodes: Cervical, supraclavicular, and axillary nodes normal. Heart:regular rate and rhythm, S1, S2 normal, no murmur, click, rub or gallop Lung:chest clear, no wheezing, rales, normal symmetric air entry.  Abdomin: soft, non-tender, without masses or organomegaly EXT:no erythema, induration, or nodules   Lab Results: Lab Results  Component Value Date   WBC 8.0 02/04/2016   HGB 14.9 02/04/2016   HCT 44.9 02/04/2016   MCV 85.5 02/04/2016   PLT 176 02/04/2016     Chemistry      Component Value Date/Time   NA 143 01/14/2016 0938   K 4.1 01/14/2016 0938   CL 100 02/21/2014 0902   CO2 24 01/14/2016  6063   BUN 14.5 01/14/2016 0938   CREATININE 0.7 01/14/2016 0938      Component Value Date/Time   CALCIUM 10.6 (H) 01/14/2016 0938   ALKPHOS 77 01/14/2016 0938   AST 18 01/14/2016 0938   ALT 17 01/14/2016 0938   BILITOT 0.30 01/14/2016  0938       Impression and Plan:  75 year old woman with the following issues:  1. 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 She was initially found to have a lesion on the right upper lip and subsequently developed cervical adenopathy with the final pathological staging as IIIB (TisN2aM0). She is status post Mohs procedure followed by neck dissection completed in June 2017.  She is currently receiving adjuvant therapy utilizing ipilimumab at 3 mg/kg every 3 weeks. She is ready to proceed with a second treatment after tolerating the first treatment without any side effects.  2. History of breast cancer: She continues to follow with Dr. Marin Olp regarding this issue.  3. Pulmonary nodule: She is scheduled to have a repeat PET scan in the near future.  4. Follow-up: Will be in 3 weeks for cycle 3 of therapy.   [facility], MD 9/19/20179:07 AM

## 2016-02-04 NOTE — Patient Instructions (Signed)
[facility] Discharge Instructions for Patients Receiving Chemotherapy  Today you received the following chemotherapy agents Yervoy  To help prevent nausea and vomiting after your treatment, we encourage you to take your nausea medication as directed.   If you develop nausea and vomiting that is not controlled by your nausea medication, call the clinic.   BELOW ARE SYMPTOMS THAT SHOULD BE REPORTED IMMEDIATELY:  *FEVER GREATER THAN 100.5 F  *CHILLS WITH OR WITHOUT FEVER  NAUSEA AND VOMITING THAT IS NOT CONTROLLED WITH YOUR NAUSEA MEDICATION  *UNUSUAL SHORTNESS OF BREATH  *UNUSUAL BRUISING OR BLEEDING  TENDERNESS IN MOUTH AND THROAT WITH OR WITHOUT PRESENCE OF ULCERS  *URINARY PROBLEMS  *BOWEL PROBLEMS  UNUSUAL RASH Items with * indicate a potential emergency and should be followed up as soon as possible.  Feel free to call the clinic you have any questions or concerns. The clinic phone number is [phone].  Please show the [facility] at check-in to the Emergency Department and triage nurse.   Ipilimumab injection What is this medicine? IPILIMUMAB (IP i LIM ue mab) is a monoclonal antibody. It is used to treat melanoma, a type of skin cancer. This medicine may be used for other purposes; ask your health care provider or pharmacist if you have questions. What should I tell my health care provider before I take this medicine? They need to know if you have any of these conditions: -Addison's disease -blood in your stools (black or tarry stools) or if you have blood in your vomit -eye disease, vision problems -history of pancreatitis -history of stomach bleeding -immune system problems -inflammatory bowel disease -kidney disease -liver disease -lupus -myasthenia gravis -organ transplant -rheumatoid arthritis -sarcoidosis -stomach or intestine problems -thyroid disease -tingling of the fingers or toes, or other nerve disorder -an unusual or  allergic reaction to ipilimumab, other medicines, foods, dyes, or preservatives -pregnant or trying to get pregnant -breast-feeding How should I use this medicine? This medicine is for infusion into a vein. It is given by a health care professional in a hospital or clinic setting. A special MedGuide will be given to you before each treatment. Be sure to read this information carefully each time. Talk to your pediatrician regarding the use of this medicine in children. Special care may be needed. Overdosage: If you think you have taken too much of this medicine contact a poison control center or emergency room at once. NOTE: This medicine is only for you. Do not share this medicine with others. What if I miss a dose? It is important not to miss your dose. Call your doctor or health care professional if you are unable to keep an appointment. What may interact with this medicine? Interactions are not expected. This list may not describe all possible interactions. Give your health care provider a list of all the medicines, herbs, non-prescription drugs, or dietary supplements you use. Also tell them if you smoke, drink alcohol, or use illegal drugs. Some items may interact with your medicine. What should I watch for while using this medicine? Tell your doctor or healthcare professional if your symptoms do not start to get better or if they get worse. Do not become pregnant while taking this medicine or for 3 months after stopping it. Women should inform their doctor if they wish to become pregnant or think they might be pregnant. There is a potential for serious side effects to an unborn child. Talk to your health care professional or pharmacist for more  information. Do not breast-feed an infant while taking this medicine or for 3 months after the last dose. Your condition will be monitored carefully while you are receiving this medicine. You may need blood work done while you are taking this  medicine. What side effects may I notice from receiving this medicine? Side effects that you should report to your doctor or health care professional as soon as possible: -allergic reactions like skin rash, itching or hives, swelling of the face, lips, or tongue -black, tarry stools -bloody or watery diarrhea -changes in vision -dizziness -eye pain -fast, irregular heartbeat -feeling anxious -feeling faint or lightheaded, falls -nausea, vomiting -pain, tingling, numbness in the hands or feet -redness, blistering, peeling or loosening of the skin, including inside the mouth -signs and symptoms of liver injury like dark yellow or brown urine; general ill feeling or flu-like symptoms; light-colored stools; loss of appetite; nausea; right upper belly pain; unusually weak or tired; yellowing of the eyes or skin -unusual bleeding or bruising Side effects that usually do not require medical attention (Report these to your doctor or health care professional if they continue or are bothersome.): -headache -loss of appetite -trouble sleeping This list may not describe all possible side effects. Call your doctor for medical advice about side effects. You may report side effects to FDA at 1-800-FDA-1088. Where should I keep my medicine? This drug is given in a hospital or clinic and will not be stored at home. NOTE: This sheet is a summary. It may not cover all possible information. If you have questions about this medicine, talk to your doctor, pharmacist, or health care provider.    2016, Elsevier/Gold Standard. (2014-07-03 17:10:32)

## 2016-02-12 ENCOUNTER — Telehealth: Payer: Self-pay | Admitting: Oncology

## 2016-02-12 NOTE — Telephone Encounter (Signed)
Left message for patient re next appointment for 10/10. Schedule mailed.

## 2016-02-20 ENCOUNTER — Other Ambulatory Visit (HOSPITAL_BASED_OUTPATIENT_CLINIC_OR_DEPARTMENT_OTHER): Payer: Medicare Other

## 2016-02-20 ENCOUNTER — Ambulatory Visit: Payer: Medicare Other | Admitting: Hematology & Oncology

## 2016-02-20 ENCOUNTER — Other Ambulatory Visit: Payer: Medicare Other

## 2016-02-25 ENCOUNTER — Other Ambulatory Visit (HOSPITAL_BASED_OUTPATIENT_CLINIC_OR_DEPARTMENT_OTHER): Payer: Medicare Other

## 2016-02-25 ENCOUNTER — Ambulatory Visit (HOSPITAL_BASED_OUTPATIENT_CLINIC_OR_DEPARTMENT_OTHER): Payer: Medicare Other | Admitting: Oncology

## 2016-02-25 ENCOUNTER — Ambulatory Visit (HOSPITAL_BASED_OUTPATIENT_CLINIC_OR_DEPARTMENT_OTHER): Payer: Medicare Other

## 2016-02-25 VITALS — BP 148/75 | HR 83 | Temp 98.0°F | Resp 16 | Ht 63.0 in | Wt 123.4 lb

## 2016-02-25 DIAGNOSIS — C439 Malignant melanoma of skin, unspecified: Secondary | ICD-10-CM

## 2016-02-25 DIAGNOSIS — Z79899 Other long term (current) drug therapy: Secondary | ICD-10-CM

## 2016-02-25 DIAGNOSIS — C770 Secondary and unspecified malignant neoplasm of lymph nodes of head, face and neck: Secondary | ICD-10-CM | POA: Diagnosis not present

## 2016-02-25 DIAGNOSIS — Z853 Personal history of malignant neoplasm of breast: Secondary | ICD-10-CM

## 2016-02-25 DIAGNOSIS — C430 Malignant melanoma of lip: Secondary | ICD-10-CM

## 2016-02-25 DIAGNOSIS — Z5112 Encounter for antineoplastic immunotherapy: Secondary | ICD-10-CM

## 2016-02-25 DIAGNOSIS — R911 Solitary pulmonary nodule: Secondary | ICD-10-CM

## 2016-02-25 LAB — COMPREHENSIVE METABOLIC PANEL
ALBUMIN: 3.8 g/dL (ref 3.5–5.0)
ALK PHOS: 67 U/L (ref 40–150)
ALT: 12 U/L (ref 0–55)
AST: 13 U/L (ref 5–34)
Anion Gap: 11 mEq/L (ref 3–11)
BUN: 13.2 mg/dL (ref 7.0–26.0)
CALCIUM: 10.3 mg/dL (ref 8.4–10.4)
CO2: 22 mEq/L (ref 22–29)
Chloride: 107 mEq/L (ref 98–109)
Creatinine: 0.7 mg/dL (ref 0.6–1.1)
EGFR: 84 mL/min/{1.73_m2} — ABNORMAL LOW (ref >90)
GLUCOSE: 112 mg/dL (ref 70–140)
POTASSIUM: 4.5 meq/L (ref 3.5–5.1)
SODIUM: 140 meq/L (ref 136–145)
TOTAL PROTEIN: 7.2 g/dL (ref 6.4–8.3)

## 2016-02-25 LAB — CBC WITH DIFFERENTIAL/PLATELET
BASO%: 0.8 % (ref 0.0–2.0)
BASOS ABS: 0.1 10*3/uL (ref 0.0–0.1)
EOS ABS: 0.1 10*3/uL (ref 0.0–0.5)
EOS%: 1.7 % (ref 0.0–7.0)
HEMATOCRIT: 47.3 % — AB (ref 34.8–46.6)
HEMOGLOBIN: 15.4 g/dL (ref 11.6–15.9)
LYMPH#: 2.8 10*3/uL (ref 0.9–3.3)
LYMPH%: 33.1 % (ref 14.0–49.7)
MCH: 27.3 pg (ref 25.1–34.0)
MCHC: 32.6 g/dL (ref 31.5–36.0)
MCV: 84 fL (ref 79.5–101.0)
MONO#: 0.6 10*3/uL (ref 0.1–0.9)
MONO%: 7.3 % (ref 0.0–14.0)
NEUT%: 57.1 % (ref 38.4–76.8)
NEUTROS ABS: 4.8 10*3/uL (ref 1.5–6.5)
Platelets: 196 10*3/uL (ref 145–400)
RBC: 5.64 10*6/uL — ABNORMAL HIGH (ref 3.70–5.45)
RDW: 14.3 % (ref 11.2–14.5)
WBC: 8.4 10*3/uL (ref 3.9–10.3)

## 2016-02-25 LAB — TSH: TSH: 19.335 m[IU]/L — AB (ref 0.308–3.960)

## 2016-02-25 MED ORDER — SODIUM CHLORIDE 0.9 % IV SOLN
Freq: Once | INTRAVENOUS | Status: AC
Start: 2016-02-25 — End: 2016-02-25
  Administered 2016-02-25: 11:00:00 via INTRAVENOUS

## 2016-02-25 MED ORDER — PREDNISONE 10 MG PO TABS: ORAL_TABLET | ORAL | 0 refills | Status: DC

## 2016-02-25 MED ORDER — SODIUM CHLORIDE 0.9 % IV SOLN
3.0000 mg/kg | Freq: Once | INTRAVENOUS | Status: AC
  Administered 2016-02-25: 165 mg via INTRAVENOUS
  Filled 2016-02-25: qty 33

## 2016-02-25 NOTE — Patient Instructions (Signed)
[facility] Discharge Instructions for Patients Receiving Chemotherapy  Today you received the following chemotherapy agents:  Yervoy (ipilimumab)  To help prevent nausea and vomiting after your treatment, we encourage you to take your nausea medication as prescribed.   If you develop nausea and vomiting that is not controlled by your nausea medication, call the clinic.   BELOW ARE SYMPTOMS THAT SHOULD BE REPORTED IMMEDIATELY:  *FEVER GREATER THAN 100.5 F  *CHILLS WITH OR WITHOUT FEVER  NAUSEA AND VOMITING THAT IS NOT CONTROLLED WITH YOUR NAUSEA MEDICATION  *UNUSUAL SHORTNESS OF BREATH  *UNUSUAL BRUISING OR BLEEDING  TENDERNESS IN MOUTH AND THROAT WITH OR WITHOUT PRESENCE OF ULCERS  *URINARY PROBLEMS  *BOWEL PROBLEMS  UNUSUAL RASH Items with * indicate a potential emergency and should be followed up as soon as possible.  Feel free to call the clinic you have any questions or concerns. The clinic phone number is [phone].  Please show the [facility] at check-in to the Emergency Department and triage nurse.

## 2016-02-25 NOTE — Progress Notes (Signed)
Hematology and Oncology Follow Up Visit  Victoria Castro [id number] 10-Dec-1940 75 y.o. 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 She developed subsequently cervical adenopathy in June 2017. Her final pathological staging was IIIB (TisN2aM0.    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 She underwent Mohs procedure in February 2017 which did not show any residual malignancy at that time.  She subsequently started developing a tender mass in her right submandibular region which did not improve with antibiotics. Evaluate her aspiration done on 09/16/2015 showed atypical cells suspicious for malignancy.  PET scan showed right-sided lymphadenopathy at the level of IIa. She subsequently underwent a repeat biopsy on 10/02/2015 which confirmed the presence of metastatic melanoma. She is status post neck dissection performed on 10/22/2015 which showed 2 out of 7 lymph nodes involved with metastatic melanoma.  Current therapy: Adjuvant ipilimumab at reduced dose of 3 mg/kg every 3 weeks for a total of 4 doses. She is here the third dose.  Interim History: Victoria Castro presents today for a follow-up visit. Since the last visit, she denies to feel well without any complications. She denied any nausea, abdominal pain or diarrhea. She denied any respiratory symptoms. She denied any skin rashes or lesions. She denied any neurological deficits. She remains in excellent health and performance status and attends to activities of daily living. Her appetite and quality of life remained unchanged. She denied any recent skin lesions or irritation.  She is not report any headaches, blurry vision, syncope or seizures. She does not report any fevers or chills or sweats. She does not report any cough, wheezing or  hemoptysis. She does not report any chest pain, palpitation, orthopnea or leg edema. She does not report any frequency urgency or hesitancy. She does not report any skeletal complaints. Remaining review of systems unremarkable.   Medications: I have reviewed the patient's current medications.  Current Outpatient Prescriptions  Medication Sig Dispense Refill  . amLODipine-benazepril (LOTREL) 5-20 MG per capsule Take 1 capsule by mouth daily.    . Cholecalciferol (VITAMIN D) 2000 UNITS tablet Take 2,000 Units by mouth daily.     No current facility-administered medications for this visit.      Allergies: No Known Allergies  Past Medical History, Surgical history, Social history, and Family History were reviewed and updated.   Physical Exam: Blood pressure (!) 148/75, pulse 83, temperature 98 F (36.7 C), temperature source Oral, resp. rate 16, height '5\' 3"'$  (1.6 m), weight 123 lb 6.4 oz (56 kg), SpO2 100 %. ECOG: 0 General appearance:  well-appearing woman without distress. Head: Normocephalic, without obvious abnormality no oral thrush. Neck: no adenopathy Lymph nodes: Cervical, supraclavicular, and axillary nodes normal. Heart:regular rate and rhythm, S1, S2 normal, no murmur, click, rub or gallop Lung:chest clear, no wheezing, rales, normal symmetric air entry.  Abdomin: soft, non-tender, without masses or organomegaly shifting dullness or ascites. EXT:no erythema, induration, or nodules   Lab Results: Lab Results  Component Value Date   WBC 8.4 02/25/2016   HGB 15.4 02/25/2016   HCT 47.3 (H) 02/25/2016   MCV 84.0 02/25/2016   PLT 196 02/25/2016     Chemistry      Component Value Date/Time   NA 142 02/04/2016 0836   K 4.2 02/04/2016 0836   CL 100 02/21/2014 0902  CO2 23 02/04/2016 0836   BUN 14.9 02/04/2016 0836   CREATININE 0.7 02/04/2016 0836      Component Value Date/Time   CALCIUM 10.3 02/04/2016 0836   ALKPHOS 69 02/04/2016 0836   AST 17 02/04/2016 0836    ALT 16 02/04/2016 0836   BILITOT 0.33 02/04/2016 0836       Impression and Plan:  75 year old woman with the following issues:  1. 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 She was initially found to have a lesion on the right upper lip and subsequently developed cervical adenopathy with the final pathological staging as IIIB (TisN2aM0). She is status post Mohs procedure followed by neck dissection completed in June 2017.  She is currently receiving adjuvant therapy utilizing ipilimumab at 3 mg/kg every 3 weeks. She is ready to proceed with the third infusion out of planned 4. She denied any complications and agreeable to proceed.  2. History of breast cancer: She continues to follow with Dr. Marin Olp regarding this issue.  3. Pulmonary nodule: She is scheduled to have a repeat PET scan in the near future.  4. 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 She denied any thyroid, skin or lung issues.  5. Follow-up: Will be in 3 weeks for cycle 4 of therapy.   WIOXBD,ZHGDJ, MD 10/10/20179:16 AM

## 2016-03-17 ENCOUNTER — Other Ambulatory Visit (HOSPITAL_BASED_OUTPATIENT_CLINIC_OR_DEPARTMENT_OTHER): Payer: Medicare Other

## 2016-03-17 ENCOUNTER — Ambulatory Visit (HOSPITAL_BASED_OUTPATIENT_CLINIC_OR_DEPARTMENT_OTHER): Payer: Medicare Other

## 2016-03-17 ENCOUNTER — Ambulatory Visit (HOSPITAL_BASED_OUTPATIENT_CLINIC_OR_DEPARTMENT_OTHER): Payer: Medicare Other | Admitting: Oncology

## 2016-03-17 VITALS — BP 131/48 | HR 94 | Temp 97.6°F | Resp 17 | Ht 63.0 in | Wt 120.4 lb

## 2016-03-17 DIAGNOSIS — C770 Secondary and unspecified malignant neoplasm of lymph nodes of head, face and neck: Secondary | ICD-10-CM

## 2016-03-17 DIAGNOSIS — R918 Other nonspecific abnormal finding of lung field: Secondary | ICD-10-CM | POA: Diagnosis not present

## 2016-03-17 DIAGNOSIS — C430 Malignant melanoma of lip: Secondary | ICD-10-CM

## 2016-03-17 DIAGNOSIS — Z23 Encounter for immunization: Secondary | ICD-10-CM

## 2016-03-17 DIAGNOSIS — Z5112 Encounter for antineoplastic immunotherapy: Secondary | ICD-10-CM | POA: Diagnosis not present

## 2016-03-17 DIAGNOSIS — Z79899 Other long term (current) drug therapy: Secondary | ICD-10-CM

## 2016-03-17 DIAGNOSIS — C439 Malignant melanoma of skin, unspecified: Secondary | ICD-10-CM

## 2016-03-17 DIAGNOSIS — Z853 Personal history of malignant neoplasm of breast: Secondary | ICD-10-CM | POA: Diagnosis not present

## 2016-03-17 LAB — COMPREHENSIVE METABOLIC PANEL
ALT: 15 U/L (ref 0–55)
AST: 12 U/L (ref 5–34)
Albumin: 3.8 g/dL (ref 3.5–5.0)
Alkaline Phosphatase: 80 U/L (ref 40–150)
Anion Gap: 10 mEq/L (ref 3–11)
BUN: 14.4 mg/dL (ref 7.0–26.0)
CALCIUM: 10.5 mg/dL — AB (ref 8.4–10.4)
CHLORIDE: 106 meq/L (ref 98–109)
CO2: 24 meq/L (ref 22–29)
CREATININE: 0.7 mg/dL (ref 0.6–1.1)
EGFR: 83 mL/min/{1.73_m2} — ABNORMAL LOW (ref >90)
GLUCOSE: 135 mg/dL (ref 70–140)
Potassium: 4 mEq/L (ref 3.5–5.1)
Sodium: 140 mEq/L (ref 136–145)
Total Bilirubin: 0.38 mg/dL (ref 0.20–1.20)
Total Protein: 7.7 g/dL (ref 6.4–8.3)

## 2016-03-17 LAB — CBC WITH DIFFERENTIAL/PLATELET
BASO%: 0.7 % (ref 0.0–2.0)
Basophils Absolute: 0.1 10*3/uL (ref 0.0–0.1)
EOS%: 1.2 % (ref 0.0–7.0)
Eosinophils Absolute: 0.1 10*3/uL (ref 0.0–0.5)
HEMATOCRIT: 46.8 % — AB (ref 34.8–46.6)
HGB: 15.3 g/dL (ref 11.6–15.9)
LYMPH#: 2.5 10*3/uL (ref 0.9–3.3)
LYMPH%: 28.1 % (ref 14.0–49.7)
MCH: 27.3 pg (ref 25.1–34.0)
MCHC: 32.6 g/dL (ref 31.5–36.0)
MCV: 83.7 fL (ref 79.5–101.0)
MONO#: 0.5 10*3/uL (ref 0.1–0.9)
MONO%: 5.6 % (ref 0.0–14.0)
NEUT#: 5.6 10*3/uL (ref 1.5–6.5)
NEUT%: 64.4 % (ref 38.4–76.8)
Platelets: 182 10*3/uL (ref 145–400)
RBC: 5.59 10*6/uL — AB (ref 3.70–5.45)
RDW: 14.9 % — ABNORMAL HIGH (ref 11.2–14.5)
WBC: 8.8 10*3/uL (ref 3.9–10.3)

## 2016-03-17 LAB — TSH: TSH: 14.106 m(IU)/L — ABNORMAL HIGH (ref 0.308–3.960)

## 2016-03-17 MED ORDER — SODIUM CHLORIDE 0.9 % IV SOLN
3.0000 mg/kg | Freq: Once | INTRAVENOUS | Status: AC
  Administered 2016-03-17: 165 mg via INTRAVENOUS
  Filled 2016-03-17: qty 33

## 2016-03-17 MED ORDER — INFLUENZA VAC SPLIT QUAD 0.5 ML IM SUSY
0.5000 mL | PREFILLED_SYRINGE | Freq: Once | INTRAMUSCULAR | Status: AC
  Administered 2016-03-17: 0.5 mL via INTRAMUSCULAR
  Filled 2016-03-17: qty 0.5

## 2016-03-17 MED ORDER — SODIUM CHLORIDE 0.9 % IV SOLN
Freq: Once | INTRAVENOUS | Status: AC
  Administered 2016-03-17: 10:00:00 via INTRAVENOUS

## 2016-03-17 NOTE — Patient Instructions (Signed)
[facility] Discharge Instructions for Patients Receiving Chemotherapy  Today you received the following chemotherapy agents:  Yervoy (ipilimumab)  To help prevent nausea and vomiting after your treatment, we encourage you to take your nausea medication as prescribed.   If you develop nausea and vomiting that is not controlled by your nausea medication, call the clinic.   BELOW ARE SYMPTOMS THAT SHOULD BE REPORTED IMMEDIATELY:  *FEVER GREATER THAN 100.5 F  *CHILLS WITH OR WITHOUT FEVER  NAUSEA AND VOMITING THAT IS NOT CONTROLLED WITH YOUR NAUSEA MEDICATION  *UNUSUAL SHORTNESS OF BREATH  *UNUSUAL BRUISING OR BLEEDING  TENDERNESS IN MOUTH AND THROAT WITH OR WITHOUT PRESENCE OF ULCERS  *URINARY PROBLEMS  *BOWEL PROBLEMS  UNUSUAL RASH Items with * indicate a potential emergency and should be followed up as soon as possible.  Feel free to call the clinic you have any questions or concerns. The clinic phone number is [phone].  Please show the [facility] at check-in to the Emergency Department and triage nurse.

## 2016-03-17 NOTE — Progress Notes (Signed)
Hematology and Oncology Follow Up Visit  Victoria Castro [id number] August 23, 1940 75 y.o. 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 She developed subsequently cervical adenopathy in June 2017. Her final pathological staging was IIIB (TisN2aM0.    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 She underwent Mohs procedure in February 2017 which did not show any residual malignancy at that time.  She subsequently started developing a tender mass in her right submandibular region which did not improve with antibiotics. Evaluate her aspiration done on 09/16/2015 showed atypical cells suspicious for malignancy.  PET scan showed right-sided lymphadenopathy at the level of IIa. She subsequently underwent a repeat biopsy on 10/02/2015 which confirmed the presence of metastatic melanoma. She is status post neck dissection performed on 10/22/2015 which showed 2 out of 7 lymph nodes involved with metastatic melanoma.  Current therapy: Adjuvant ipilimumab at reduced dose of 3 mg/kg every 3 weeks for a total of 4 doses. She is here for cycle 4 out planned 4.   Interim History: Victoria Castro presents today for a follow-up visit. Since the last visit, she reports no major changes in her health. She received her last treatment without any complications. She did report mild fatigue that quickly dissipated. She denied any nausea, abdominal pain or diarrhea. She denied any respiratory symptoms. She denied any skin rashes or lesions. She denied any neurological deficits.   She continues to have excellent performance status and was able to travel to the beach by her self and dry without difficulties.  She is not report any headaches, blurry vision, syncope or seizures. She does not report any fevers or chills or  sweats. She does not report any cough, wheezing or hemoptysis. She does not report any chest pain, palpitation, orthopnea or leg edema. She does not report any frequency urgency or hesitancy. She does not report any skeletal complaints. Remaining review of systems unremarkable.   Medications: I have reviewed the patient's current medications.  Current Outpatient Prescriptions  Medication Sig Dispense Refill  . amLODipine-benazepril (LOTREL) 5-20 MG per capsule Take 1 capsule by mouth daily.    . Cholecalciferol (VITAMIN D) 2000 UNITS tablet Take 2,000 Units by mouth daily.    . predniSONE (DELTASONE) 10 MG tablet Take as directed in ipilimumab Victoria Castro) instructions. 120 tablet 0   No current facility-administered medications for this visit.      Allergies: No Known Allergies  Past Medical History, Surgical history, Social history, and Family History were reviewed and updated.   Physical Exam: Blood pressure (!) 131/48, pulse 94, temperature 97.6 F (36.4 C), temperature source Oral, resp. rate 17, height '5\' 3"'$  (1.6 m), weight 120 lb 6.4 oz (54.6 kg), SpO2 100 %. ECOG: 0 General appearance:  Alert, awake woman without distress. Head: Normocephalic, without obvious abnormality no oral ulcers or lesions. Neck: no adenopathy Lymph nodes: Cervical, supraclavicular, and axillary nodes normal. Heart:regular rate and rhythm, S1, S2 normal, no murmur, click, rub or gallop Lung:chest clear, no wheezing, rales, normal symmetric air entry.  Abdomin: soft, non-tender, without masses or organomegaly no rebound or guarding. EXT:no erythema, induration, or nodules   Lab Results: Lab Results  Component Value Date   WBC 8.8 03/17/2016   HGB 15.3 03/17/2016   HCT 46.8 (H) 03/17/2016   MCV 83.7 03/17/2016   PLT 182  03/17/2016     Chemistry      Component Value Date/Time   NA 140 02/25/2016 0847   K 4.5 02/25/2016 0847   CL 100 02/21/2014 0902   CO2 22 02/25/2016 0847   BUN 13.2 02/25/2016  0847   CREATININE 0.7 02/25/2016 0847      Component Value Date/Time   CALCIUM 10.3 02/25/2016 0847   ALKPHOS 67 02/25/2016 0847   AST 13 02/25/2016 0847   ALT 12 02/25/2016 0847   BILITOT <0.30 02/25/2016 0847       Impression and Plan:  75 year old woman with the following issues:  1. 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 She was initially found to have a lesion on the right upper lip and subsequently developed cervical adenopathy with the final pathological staging as IIIB (TisN2aM0). She is status post Mohs procedure followed by neck dissection completed in June 2017.  She is currently receiving adjuvant therapy utilizing ipilimumab at 3 mg/kg every 3 weeks. She is ready to proceed with the fourth infusion out of planned 4. She denied any complications and agreeable to proceed.  Upon completion of these treatments, she will return periodically for surveillance purposes. I also instructed her to follow with dermatology for routine skin examination.  2. History of breast cancer: She continues to follow with Dr. Marin Castro regarding this issue.  3. Pulmonary nodule: She is scheduled to have a repeat PET scan in the near future.  4. 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 She denied any thyroid, skin or lung issues.  5. Follow-up: Will be in 4 weeks.    [facility], MD 10/31/20179:41 AM

## 2016-03-31 ENCOUNTER — Telehealth: Payer: Self-pay | Admitting: General Practice

## 2016-03-31 NOTE — Telephone Encounter (Signed)
Left msg regarding December appts.

## 2016-04-02 ENCOUNTER — Telehealth: Payer: Self-pay | Admitting: *Deleted

## 2016-04-02 NOTE — Telephone Encounter (Signed)
This RN called and spoke with patient regarding cough she has had a for a month. Per Dr. Alen Blew, she needs to see her PCP. This is not coming from [facility]. Patient verbalized understanding.

## 2016-04-02 NOTE — Telephone Encounter (Signed)
Voicemail: "I finished my fourth Yervoy.  Developped a cough a month ago that has gotten worse.  What can I do for it."  Call forwarded ext [phone].

## 2016-04-22 ENCOUNTER — Ambulatory Visit (HOSPITAL_BASED_OUTPATIENT_CLINIC_OR_DEPARTMENT_OTHER): Payer: Medicare Other | Admitting: Oncology

## 2016-04-22 ENCOUNTER — Telehealth: Payer: Self-pay | Admitting: Oncology

## 2016-04-22 ENCOUNTER — Other Ambulatory Visit (HOSPITAL_BASED_OUTPATIENT_CLINIC_OR_DEPARTMENT_OTHER): Payer: Medicare Other

## 2016-04-22 VITALS — BP 138/92 | HR 103 | Temp 98.4°F | Resp 18 | Ht 63.0 in | Wt 114.3 lb

## 2016-04-22 DIAGNOSIS — R5383 Other fatigue: Secondary | ICD-10-CM

## 2016-04-22 DIAGNOSIS — R911 Solitary pulmonary nodule: Secondary | ICD-10-CM

## 2016-04-22 DIAGNOSIS — C430 Malignant melanoma of lip: Secondary | ICD-10-CM

## 2016-04-22 DIAGNOSIS — C770 Secondary and unspecified malignant neoplasm of lymph nodes of head, face and neck: Secondary | ICD-10-CM

## 2016-04-22 DIAGNOSIS — C439 Malignant melanoma of skin, unspecified: Secondary | ICD-10-CM

## 2016-04-22 DIAGNOSIS — R634 Abnormal weight loss: Secondary | ICD-10-CM

## 2016-04-22 DIAGNOSIS — E079 Disorder of thyroid, unspecified: Secondary | ICD-10-CM

## 2016-04-22 DIAGNOSIS — C3431 Malignant neoplasm of lower lobe, right bronchus or lung: Secondary | ICD-10-CM

## 2016-04-22 LAB — CBC WITH DIFFERENTIAL/PLATELET
BASO%: 0.1 % (ref 0.0–2.0)
Basophils Absolute: 0 10*3/uL (ref 0.0–0.1)
EOS ABS: 0 10*3/uL (ref 0.0–0.5)
EOS%: 0.4 % (ref 0.0–7.0)
HCT: 43.5 % (ref 34.8–46.6)
HEMOGLOBIN: 14.2 g/dL (ref 11.6–15.9)
LYMPH%: 17.8 % (ref 14.0–49.7)
MCH: 27.6 pg (ref 25.1–34.0)
MCHC: 32.6 g/dL (ref 31.5–36.0)
MCV: 84.5 fL (ref 79.5–101.0)
MONO#: 1.1 10*3/uL — AB (ref 0.1–0.9)
MONO%: 10.6 % (ref 0.0–14.0)
NEUT%: 71.1 % (ref 38.4–76.8)
NEUTROS ABS: 7.2 10*3/uL — AB (ref 1.5–6.5)
PLATELETS: 291 10*3/uL (ref 145–400)
RBC: 5.15 10*6/uL (ref 3.70–5.45)
RDW: 13.7 % (ref 11.2–14.5)
WBC: 10.1 10*3/uL (ref 3.9–10.3)
lymph#: 1.8 10*3/uL (ref 0.9–3.3)

## 2016-04-22 LAB — COMPREHENSIVE METABOLIC PANEL
ALBUMIN: 3.2 g/dL — AB (ref 3.5–5.0)
ALK PHOS: 116 U/L (ref 40–150)
ALT: 13 U/L (ref 0–55)
ANION GAP: 12 meq/L — AB (ref 3–11)
AST: 13 U/L (ref 5–34)
BILIRUBIN TOTAL: 0.57 mg/dL (ref 0.20–1.20)
BUN: 14.3 mg/dL (ref 7.0–26.0)
CO2: 27 mEq/L (ref 22–29)
Calcium: 10.5 mg/dL — ABNORMAL HIGH (ref 8.4–10.4)
Chloride: 100 mEq/L (ref 98–109)
Creatinine: 0.7 mg/dL (ref 0.6–1.1)
EGFR: 82 mL/min/{1.73_m2} — AB (ref >90)
Glucose: 113 mg/dl (ref 70–140)
Potassium: 3.7 mEq/L (ref 3.5–5.1)
Sodium: 139 mEq/L (ref 136–145)
TOTAL PROTEIN: 7.6 g/dL (ref 6.4–8.3)

## 2016-04-22 LAB — LACTATE DEHYDROGENASE: LDH: 144 U/L (ref 125–245)

## 2016-04-22 MED ORDER — MEGESTROL ACETATE 400 MG/10ML PO SUSP: 400.0000 mg | Freq: Two times a day (BID) | ORAL | 0 refills | Status: DC

## 2016-04-22 NOTE — Progress Notes (Signed)
Hematology and Oncology Follow Up Visit  Victoria Castro [id number] [birth year] 75 y.o. 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 She developed subsequently cervical adenopathy in June 2017. Her final pathological staging was IIIB (TisN2aM0.    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 She underwent Mohs procedure in February 2017 which did not show any residual malignancy at that time.  She subsequently started developing a tender mass in her right submandibular region which did not improve with antibiotics. Evaluate her aspiration done on 09/16/2015 showed atypical cells suspicious for malignancy.  PET scan showed right-sided lymphadenopathy at the level of IIa. She subsequently underwent a repeat biopsy on 10/02/2015 which confirmed the presence of metastatic melanoma. She is status post neck dissection performed on 10/22/2015 which showed 2 out of 7 lymph nodes involved with metastatic melanoma. Adjuvant ipilimumab at reduced dose of 3 mg/kg every 3 weeks for a total of 4 doses. She is supposed 4 treatments completed in 03/17/2016.  Current therapy: Supportive management.  Interim History: Victoria Castro presents today for a follow-up visit. Since the last visit, she reports some slight decline in her overall health. After receiving the fourth treatment of ipilimumab she started developing symptoms of fatigue, tiredness mild nonproductive cough and poor by mouth intake. She has lost 6 pounds since the last visit. She still able to attend to her activities of daily living but she is more fatigued. She denied any nausea, abdominal pain or diarrhea. She denied any respiratory symptoms. She denied . She denied any neurological deficits. She did report some periodic rash on her face and  trunk.  She is not report any headaches, blurry vision, syncope or seizures. She does not report any fevers or chills or sweats. She does not report any wheezing or hemoptysis. She does not report any chest pain, palpitation, orthopnea or leg edema. She does not report any frequency urgency or hesitancy. She does not report any skeletal complaints. Remaining review of systems unremarkable.   Medications: I have reviewed the patient's current medications.  Current Outpatient Prescriptions  Medication Sig Dispense Refill  . amLODipine-benazepril (LOTREL) 5-20 MG per capsule Take 1 capsule by mouth daily.    . Cholecalciferol (VITAMIN D) 2000 UNITS tablet Take 2,000 Units by mouth daily.    . megestrol (MEGACE) 400 MG/10ML suspension Take 10 mLs (400 mg total) by mouth 2 (two) times daily. 240 mL 0  . predniSONE (DELTASONE) 10 MG tablet Take as directed in ipilimumab Curt Bears) instructions. 120 tablet 0   No current facility-administered medications for this visit.      Allergies: No Known Allergies  Past Medical History, Surgical history, Social history, and Family History were reviewed and updated.   Physical Exam: Blood pressure (!) 138/92, pulse (!) 103, temperature 98.4 F (36.9 C), temperature source Oral, resp. rate 18, height '5\' 3"'$  (1.6 m), weight 114 lb 4.8 oz (51.8 kg), SpO2 100 %. ECOG: 0 General appearance:  Well-appearing woman without distress. Head: Normocephalic, without obvious abnormality no oral thrush noted. Neck: no adenopathy Lymph nodes: Cervical, supraclavicular, and axillary nodes normal. Heart:regular rate and rhythm, S1, S2 normal, no murmur, click, rub or gallop Lung:chest clear, no wheezing, rales, normal symmetric air entry.  Abdomin: soft, non-tender, without masses or organomegaly no shifting dullness or ascites. EXT:no erythema, induration, or  nodules   Lab Results: Lab Results  Component Value Date   WBC 10.1 04/22/2016   HGB 14.2 04/22/2016   HCT  43.5 04/22/2016   MCV 84.5 04/22/2016   PLT 291 04/22/2016     Chemistry      Component Value Date/Time   NA 139 04/22/2016 0749   K 3.7 04/22/2016 0749   CL 100 02/21/2014 0902   CO2 27 04/22/2016 0749   BUN 14.3 04/22/2016 0749   CREATININE 0.7 04/22/2016 0749      Component Value Date/Time   CALCIUM 10.5 (H) 04/22/2016 0749   ALKPHOS 116 04/22/2016 0749   AST 13 04/22/2016 0749   ALT 13 04/22/2016 0749   BILITOT 0.57 04/22/2016 0749       Impression and Plan:  75 year old woman with the following issues:  1. 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 She was initially found to have a lesion on the right upper lip and subsequently developed cervical adenopathy with the final pathological staging as IIIB (TisN2aM0). She is status post Mohs procedure followed by neck dissection completed in June 2017.  She is S/P adjuvant therapy utilizing ipilimumab at 3 mg/kg every 3 weeks. She is status post 4 treatments*completed without acute toxicities. She is developing some mild delayed complications that included thyroid disease, mild rash. The plan is to continue with supportive management and consider adding prednisone if her symptoms do not improve in the near future.  2. History of breast cancer: She continues to follow with Dr. Marin Olp regarding this issue.  3. Pulmonary nodule: She is scheduled to have a repeat PET scan in January 2018.  4. Weight loss, fatigue: Unclear etiology could be related to worsening thyroid disease or malignancy. She will have repeat PET scan in January 2018 to rule out malignancy. I will refer her to endocrinology for thyroid disease management. I've also recommended Megace to boost her appetite in the interim.  I instructed her to let me know about her status in the next 1-2 weeks and if she does not have any improvement, we will start low-dose prednisone at that time.  5. Follow-up: Will be in 4 weeks.    [id number],  MD 12/6/20178:35 AM

## 2016-04-22 NOTE — Telephone Encounter (Signed)
Gave patient/dtr avs report and appointments for January. [facility] radiology will call re scan.   Patient/dtr aware I will call re endocrine and dermatology referrals. Message left with [facility] and Dr. Ainsley Spinner office is having problems with the phones.

## 2016-04-29 NOTE — Telephone Encounter (Signed)
Left message for both patient and her dtr Kathrynn Humble ([phone]) re dermatology and endocrinology appointments.    Endocrinology Spoke with [facility]/Dr Jeanann Lewandowsky 05/05/16 @ 11:45 am arrive 11:30 am [place]  Phone - [phone] Fax - [phone]   Dermatology Spoke with [facility] 05/14/16 @ 10:30 am arrive 10:10 am [address]  Phone [phone]   Sent copy of referrals along with fax cover sheet to HIM to fax records to above offices.

## 2016-04-30 ENCOUNTER — Telehealth: Payer: Self-pay | Admitting: Oncology

## 2016-04-30 NOTE — Telephone Encounter (Signed)
Faxed records to [facility] dermatology and Dr. Carlis Abbott

## 2016-05-06 ENCOUNTER — Encounter: Payer: Self-pay | Admitting: *Deleted

## 2016-05-06 ENCOUNTER — Telehealth: Payer: Self-pay | Admitting: *Deleted

## 2016-05-06 ENCOUNTER — Other Ambulatory Visit: Payer: Self-pay | Admitting: Oncology

## 2016-05-06 DIAGNOSIS — C439 Malignant melanoma of skin, unspecified: Secondary | ICD-10-CM

## 2016-05-06 NOTE — Telephone Encounter (Signed)
Patient need FULL body scan d/t diagnosis of melanoma.

## 2016-05-15 ENCOUNTER — Telehealth: Payer: Self-pay | Admitting: Oncology

## 2016-05-15 NOTE — Telephone Encounter (Signed)
Patient called to reschedule lab and follow up appointment with Dr Alen Blew. 05/15/16

## 2016-05-19 ENCOUNTER — Other Ambulatory Visit: Payer: Medicare Other

## 2016-05-20 ENCOUNTER — Ambulatory Visit: Payer: Medicare Other | Admitting: Oncology

## 2016-05-25 ENCOUNTER — Other Ambulatory Visit: Payer: Medicare Other

## 2016-05-25 ENCOUNTER — Encounter (HOSPITAL_COMMUNITY): Payer: Medicare Other

## 2016-05-25 ENCOUNTER — Ambulatory Visit: Payer: Medicare Other | Admitting: Hematology & Oncology

## 2016-05-27 ENCOUNTER — Other Ambulatory Visit: Payer: Medicare Other

## 2016-05-28 ENCOUNTER — Encounter (HOSPITAL_COMMUNITY)
Admission: RE | Admit: 2016-05-28 | Discharge: 2016-05-28 | Disposition: A | Discharge status: Home / Self Care | Payer: Medicare Other | Source: Ambulatory Visit | Attending: Oncology | Admitting: Oncology

## 2016-05-28 ENCOUNTER — Other Ambulatory Visit (HOSPITAL_BASED_OUTPATIENT_CLINIC_OR_DEPARTMENT_OTHER): Payer: Medicare Other

## 2016-05-28 DIAGNOSIS — C430 Malignant melanoma of lip: Secondary | ICD-10-CM | POA: Diagnosis not present

## 2016-05-28 DIAGNOSIS — C3431 Malignant neoplasm of lower lobe, right bronchus or lung: Secondary | ICD-10-CM

## 2016-05-28 DIAGNOSIS — E079 Disorder of thyroid, unspecified: Secondary | ICD-10-CM

## 2016-05-28 DIAGNOSIS — C439 Malignant melanoma of skin, unspecified: Secondary | ICD-10-CM

## 2016-05-28 DIAGNOSIS — Z79899 Other long term (current) drug therapy: Secondary | ICD-10-CM | POA: Diagnosis not present

## 2016-05-28 LAB — COMPREHENSIVE METABOLIC PANEL
ALT: 9 U/L (ref 0–55)
AST: 10 U/L (ref 5–34)
Albumin: 3.3 g/dL — ABNORMAL LOW (ref 3.5–5.0)
Alkaline Phosphatase: 129 U/L (ref 40–150)
Anion Gap: 16 mEq/L — ABNORMAL HIGH (ref 3–11)
BILIRUBIN TOTAL: 0.94 mg/dL (ref 0.20–1.20)
BUN: 19.3 mg/dL (ref 7.0–26.0)
CO2: 21 meq/L — AB (ref 22–29)
Calcium: 10.9 mg/dL — ABNORMAL HIGH (ref 8.4–10.4)
Chloride: 101 mEq/L (ref 98–109)
Creatinine: 0.7 mg/dL (ref 0.6–1.1)
EGFR: 78 mL/min/{1.73_m2} — AB (ref >90)
GLUCOSE: 101 mg/dL (ref 70–140)
Potassium: 3.7 mEq/L (ref 3.5–5.1)
SODIUM: 138 meq/L (ref 136–145)
TOTAL PROTEIN: 7.9 g/dL (ref 6.4–8.3)

## 2016-05-28 LAB — CBC WITH DIFFERENTIAL/PLATELET
BASO%: 0.2 % (ref 0.0–2.0)
BASOS ABS: 0 10*3/uL (ref 0.0–0.1)
EOS%: 0.1 % (ref 0.0–7.0)
Eosinophils Absolute: 0 10*3/uL (ref 0.0–0.5)
HCT: 43.7 % (ref 34.8–46.6)
HGB: 14.3 g/dL (ref 11.6–15.9)
LYMPH%: 16.8 % (ref 14.0–49.7)
MCH: 27.1 pg (ref 25.1–34.0)
MCHC: 32.7 g/dL (ref 31.5–36.0)
MCV: 82.8 fL (ref 79.5–101.0)
MONO#: 1 10*3/uL — ABNORMAL HIGH (ref 0.1–0.9)
MONO%: 8.2 % (ref 0.0–14.0)
NEUT#: 8.8 10*3/uL — ABNORMAL HIGH (ref 1.5–6.5)
NEUT%: 74.7 % (ref 38.4–76.8)
NRBC: 0 % (ref 0–0)
Platelets: 315 10*3/uL (ref 145–400)
RBC: 5.28 10*6/uL (ref 3.70–5.45)
RDW: 14.5 % (ref 11.2–14.5)
WBC: 11.8 10*3/uL — ABNORMAL HIGH (ref 3.9–10.3)
lymph#: 2 10*3/uL (ref 0.9–3.3)

## 2016-05-28 LAB — GLUCOSE, CAPILLARY: GLUCOSE-CAPILLARY: 114 mg/dL — AB (ref 65–99)

## 2016-05-28 LAB — TSH: TSH: 6.943 m(IU)/L — ABNORMAL HIGH (ref 0.308–3.960)

## 2016-05-28 MED ORDER — FLUDEOXYGLUCOSE F - 18 (FDG) INJECTION
5.7800 | Freq: Once | INTRAVENOUS | Status: AC | PRN
  Administered 2016-05-28: 5.78 via INTRAVENOUS

## 2016-05-29 ENCOUNTER — Telehealth: Payer: Self-pay | Admitting: Oncology

## 2016-05-29 ENCOUNTER — Ambulatory Visit (HOSPITAL_BASED_OUTPATIENT_CLINIC_OR_DEPARTMENT_OTHER): Payer: Medicare Other | Admitting: Oncology

## 2016-05-29 VITALS — BP 139/81 | HR 117 | Temp 98.2°F | Resp 18 | Ht 63.0 in | Wt 108.2 lb

## 2016-05-29 DIAGNOSIS — C7800 Secondary malignant neoplasm of unspecified lung: Secondary | ICD-10-CM

## 2016-05-29 DIAGNOSIS — Z853 Personal history of malignant neoplasm of breast: Secondary | ICD-10-CM

## 2016-05-29 DIAGNOSIS — C430 Malignant melanoma of lip: Secondary | ICD-10-CM | POA: Diagnosis not present

## 2016-05-29 DIAGNOSIS — C7889 Secondary malignant neoplasm of other digestive organs: Secondary | ICD-10-CM | POA: Diagnosis not present

## 2016-05-29 DIAGNOSIS — C439 Malignant melanoma of skin, unspecified: Secondary | ICD-10-CM

## 2016-05-29 DIAGNOSIS — R634 Abnormal weight loss: Secondary | ICD-10-CM

## 2016-05-29 DIAGNOSIS — C787 Secondary malignant neoplasm of liver and intrahepatic bile duct: Secondary | ICD-10-CM | POA: Diagnosis not present

## 2016-05-29 DIAGNOSIS — R5383 Other fatigue: Secondary | ICD-10-CM

## 2016-05-29 DIAGNOSIS — E039 Hypothyroidism, unspecified: Secondary | ICD-10-CM

## 2016-05-29 LAB — T3 UPTAKE
Free Thyroxine Index: 2.1 (ref 1.2–4.9)
T3 Uptake Ratio: 25 % (ref 24–39)

## 2016-05-29 LAB — T4, FREE: FREE T4: 1.1 ng/dL (ref 0.82–1.77)

## 2016-05-29 LAB — T4: T4 TOTAL: 8.3 ug/dL (ref 4.5–12.0)

## 2016-05-29 MED ORDER — MIRTAZAPINE 15 MG PO TABS: 15.0000 mg | ORAL_TABLET | Freq: Every day | ORAL | 1 refills | Status: DC

## 2016-05-29 MED ORDER — MIRTAZAPINE 15 MG PO TABS
15.0000 mg | ORAL_TABLET | Freq: Every day | ORAL | 1 refills | Status: DC
Start: 2016-05-29 — End: 2016-05-29

## 2016-05-29 NOTE — Telephone Encounter (Signed)
Gave patient avs report and appointments for January. [facility] radiology will call re scan and bx.

## 2016-05-29 NOTE — Progress Notes (Signed)
Hematology and Oncology Follow Up Visit  Victoria Castro [id number] 1940/07/23 76 y.o. 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 She developed subsequently cervical adenopathy in June 2017. Her final pathological staging was IIIB ([id number].    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 She underwent Mohs procedure in February 2017 which did not show any residual malignancy at that time.  She subsequently started developing a tender mass in her right submandibular region which did not improve with antibiotics. Evaluate her aspiration done on 09/16/2015 showed atypical cells suspicious for malignancy.  PET scan showed right-sided lymphadenopathy at the level of IIa. She subsequently underwent a repeat biopsy on 10/02/2015 which confirmed the presence of metastatic melanoma. She is status post neck dissection performed on 10/22/2015 which showed 2 out of 7 lymph nodes involved with metastatic melanoma. Adjuvant ipilimumab at reduced dose of 3 mg/kg every 3 weeks for a total of 4 doses. She is supposed 4 treatments completed in 03/17/2016.  Current therapy: Supportive management.  Interim History: Victoria Castro presents today for a follow-up visit. Since the last visit, she continues to report overall decline in her status. She has been diagnosed with hypothyroidism and started on low-dose Synthroid. Her appetite remains poor and of lost more weight. She reports that tried Megace and closer to be nauseous at times. She did not gain weight on it despite taken for 3 weeks. Her mobility is normal changed but has slowed down significantly. She denied any chest pain or difficulty breathing. She denied any cough or hemoptysis.  She is not report any headaches, blurry vision, syncope or seizures. She  does not report any fevers or chills or sweats. She does not report any wheezing or hemoptysis. She does not report any chest pain, palpitation, orthopnea or leg edema. She does not report any frequency urgency or hesitancy. She does not report any skeletal complaints. Remaining review of systems unremarkable.   Medications: I have reviewed the patient's current medications.  Current Outpatient Prescriptions  Medication Sig Dispense Refill  . amLODipine-benazepril (LOTREL) 5-20 MG per capsule Take 1 capsule by mouth daily.    . Cholecalciferol (VITAMIN D) 2000 UNITS tablet Take 2,000 Units by mouth daily.    Marland Kitchen levothyroxine (SYNTHROID, LEVOTHROID) 25 MCG tablet Take 25 mcg by mouth daily before breakfast.    . megestrol (MEGACE) 400 MG/10ML suspension Take 10 mLs (400 mg total) by mouth 2 (two) times daily. 240 mL 0  . predniSONE (DELTASONE) 10 MG tablet Take as directed in ipilimumab Victoria Castro) instructions. 120 tablet 0  . mirtazapine (REMERON) 15 MG tablet Take 1 tablet (15 mg total) by mouth at bedtime. 30 tablet 1   No current facility-administered medications for this visit.      Allergies: No Known Allergies  Past Medical History, Surgical history, Social history, and Family History were reviewed and updated.   Physical Exam: Blood pressure 139/81, pulse (!) 117, temperature 98.2 F (36.8 C), temperature source Oral, resp. rate 18, height 5' 3"  (1.6 m), weight 108 lb 3.2 oz (49.1 kg), SpO2 99 %. ECOG: 0 General appearance:  Chronically ill-appearing woman. Without distress. Head: Normocephalic, without obvious abnormality no oral ulcers or lesions. Neck: no adenopathy Lymph nodes: Cervical, supraclavicular, and axillary nodes normal. Heart:regular rate and rhythm, S1, S2 normal, no murmur, click, rub  or gallop Lung:chest clear, no wheezing, rales, normal symmetric air entry.  Abdomin: soft, non-tender, without masses or organomegaly no rebound or guarding. EXT:no erythema,  induration, or nodules   Lab Results: Lab Results  Component Value Date   WBC 11.8 (H) 05/28/2016   HGB 14.3 05/28/2016   HCT 43.7 05/28/2016   MCV 82.8 05/28/2016   PLT 315 05/28/2016     Chemistry      Component Value Date/Time   NA 138 05/28/2016 1040   K 3.7 05/28/2016 1040   CL 100 02/21/2014 0902   CO2 21 (L) 05/28/2016 1040   BUN 19.3 05/28/2016 1040   CREATININE 0.7 05/28/2016 1040      Component Value Date/Time   CALCIUM 10.9 (H) 05/28/2016 1040   ALKPHOS 129 05/28/2016 1040   AST 10 05/28/2016 1040   ALT 9 05/28/2016 1040   BILITOT 0.94 05/28/2016 1040     EXAM: NUCLEAR MEDICINE PET WHOLE BODY  TECHNIQUE: 5.8 mCi F-18 FDG was injected intravenously. Full-ring PET imaging was performed from the vertex to the feet after the radiotracer. CT data was obtained and used for attenuation correction and anatomic localization.  FASTING BLOOD GLUCOSE:  Value: 114 mg/dl  COMPARISON:  Chest CT on 02/21/2014  FINDINGS: HEAD/NECK  No hypermetabolic activity in the scalp. Postop changes from previous right neck dissection. No hypermetabolic cervical lymph nodes. Thyroid gland is diffusely enlarged and shows diffuse FDG uptake. No focal thyroid nodule identified.  CHEST  Hypermetabolic mediastinal lymphadenopathy seen in lateral aortic region measuring 3.2 cm, with SUV max of 24.6. Hypermetabolic bilateral hilar lymphadenopathy is also seen. On the right, this measures 2.8 cm on image 92/4, with SUV max of 17.7. On the left, this measures 2.9 cm on image 92/4, with SUV max of 29.3.  A solid lobulated pulmonary nodule is seen in the posterior left lower lobe which is new since previous CT, with SUV max of 10.9. 5 mm pulmonary nodule in the lateral left lower lobe on image 53/7 is stable since 2015 and shows no hypermetabolic activity, consistent with benign etiology.  A 13 mm hypermetabolic soft tissue density is seen in the medial right breast  adjacent to surgical clips. This is new since previous study and is hypermetabolic with SUV max of 8.8.  ABDOMEN/PELVIS  Several hypermetabolic lesions are seen in the liver, which are not visualized or measurable on noncontrast CT images, better consistent liver metastases. Index lesion in the left lobe has SUV max of 23.4, and index lesion in the posterior right hepatic lobe has SUV max of 14.9. Hypermetabolic soft tissue density involving the medial aspect of the spleen measures 2.6 x 1.8 cm on image 121/4, and has SUV max of 13.0.  Hypermetabolic lymphadenopathy is also seen in the area the gastrohepatic ligament along the posterior margin of the left lobe, largest measuring 11 mm short axis on image 127/4, with SUV max of 10.9. 9 mm hypermetabolic lymph node is seen in the left common iliac chain, which has SUV max of 6.5.  Hypermetabolic subcutaneous soft tissue nodule is seen in the anteromedial left buttocks, which measures 1.3 x 0.8 cm on image 213/4, and has SUV max of 10.2.  Left renal parenchymal scarring noted. 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 Aortic atherosclerosis.  SKELETON  No focal hypermetabolic activity to suggest skeletal metastasis.  EXTREMITIES  No abnormal hypermetabolic activity in the lower extremities.  IMPRESSION:  Hypermetabolic lymphadenopathy in bilateral hilar regions, left mediastinum, gastrohepatic ligament, and left common iliac chain, consistent with metastatic disease.  1.8 cm hypermetabolic left lower lobe pulmonary nodule, consistent with pulmonary metastasis.  Several hypermetabolic liver metastases, and hypermetabolic metastasis involving the medial aspect of the spleen.  Hypermetabolic soft tissue nodules in medial right breast and left buttock, consistent with soft tissue metastases.   Impression and Plan:  76 year old woman with the  following issues:  1. 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 She was initially found to have a lesion on the right upper lip and subsequently developed cervical adenopathy with the final pathological staging as IIIB (TisN2aM0). She is status post Mohs procedure followed by neck dissection completed in June 2017.  She is S/P adjuvant therapy utilizing ipilimumab at 3 mg/kg every 3 weeks. She is status post 4 treatments completed without acute toxicities. She did develop delayed toxicity including hypothyroidism which she is currently being treated for.  PET CT scan obtained on 05/28/2016 showed evidence of metastatic disease in the liver as well as in the lung. This is likely represents metastatic melanoma.  Management options were reviewed today including supportive care only versus more aggressive approach. I feel the next step if aggressive approach is needed would be to biopsy the liver lesions. The purpose of the biopsy is not only to confirm malignancy as well as confirm metastatic melanoma. Also would be important to to check BRAF status which will dictate her treatment options. It is possible that her tumor is BRAF mutated tumor and likely will respond with targeted therapy. If it's not, second line immunotherapy with Pembrolizumab is the treatment of choice.  We will also obtain an MRI of the brain to rule out brain metastasis.  2. History of breast cancer: She continues to follow with Dr. Marin Olp regarding this issue.  3. Pulmonary nodule: PET CT scan did reveal increase in the size of the pulmonary nodule indicating malignancy.  4. Weight loss, fatigue: Likely related to advanced malignancy. I recommended stopping Megace and starting Remeron at 15 mg at nighttime.  5. Hypercalcemia: Related to malignancy. Risks and benefits of Zometa to treat hypercalcemia was discussed and she is agreeable to proceed with that. Complications include arthralgias, myalgias and  flulike symptoms. The benefit would be improvement in her hypercalcemia symptoms which could be contributing to her anorexia and fatigue.  6. Follow-up: Will be in next few weeks to discuss the results of her biopsy and initiate treatment at that time.    Zola Button, MD 1/12/20182:15 PM

## 2016-06-03 ENCOUNTER — Ambulatory Visit: Payer: Medicare Other

## 2016-06-10 ENCOUNTER — Other Ambulatory Visit: Payer: Self-pay | Admitting: Radiology

## 2016-06-10 ENCOUNTER — Other Ambulatory Visit: Payer: Self-pay | Admitting: Student

## 2016-06-11 ENCOUNTER — Ambulatory Visit (HOSPITAL_COMMUNITY)
Admission: RE | Admit: 2016-06-11 | Discharge: 2016-06-11 | Disposition: A | Discharge status: Home / Self Care | Payer: Medicare Other | Source: Ambulatory Visit | Attending: Oncology | Admitting: Oncology

## 2016-06-11 ENCOUNTER — Other Ambulatory Visit: Payer: Self-pay | Admitting: *Deleted

## 2016-06-11 ENCOUNTER — Encounter (HOSPITAL_COMMUNITY): Payer: Self-pay

## 2016-06-11 DIAGNOSIS — N6310 Unspecified lump in the right breast, unspecified quadrant: Secondary | ICD-10-CM | POA: Diagnosis not present

## 2016-06-11 DIAGNOSIS — I10 Essential (primary) hypertension: Secondary | ICD-10-CM | POA: Insufficient documentation

## 2016-06-11 DIAGNOSIS — Z87891 Personal history of nicotine dependence: Secondary | ICD-10-CM | POA: Diagnosis not present

## 2016-06-11 DIAGNOSIS — C439 Malignant melanoma of skin, unspecified: Secondary | ICD-10-CM | POA: Diagnosis present

## 2016-06-11 DIAGNOSIS — Z79899 Other long term (current) drug therapy: Secondary | ICD-10-CM | POA: Insufficient documentation

## 2016-06-11 DIAGNOSIS — R590 Localized enlarged lymph nodes: Secondary | ICD-10-CM | POA: Insufficient documentation

## 2016-06-11 DIAGNOSIS — R911 Solitary pulmonary nodule: Secondary | ICD-10-CM | POA: Diagnosis not present

## 2016-06-11 DIAGNOSIS — C787 Secondary malignant neoplasm of liver and intrahepatic bile duct: Secondary | ICD-10-CM | POA: Insufficient documentation

## 2016-06-11 HISTORY — DX: Essential (primary) hypertension: I10

## 2016-06-11 HISTORY — DX: Unspecified protein-calorie malnutrition: E46

## 2016-06-11 HISTORY — DX: Malignant melanoma of skin, unspecified: C43.9

## 2016-06-11 LAB — CBC WITH DIFFERENTIAL/PLATELET
Basophils Absolute: 0 10*3/uL (ref 0.0–0.1)
Basophils Relative: 0 %
EOS ABS: 0 10*3/uL (ref 0.0–0.7)
Eosinophils Relative: 0 %
HEMATOCRIT: 43 % (ref 36.0–46.0)
HEMOGLOBIN: 13.8 g/dL (ref 12.0–15.0)
LYMPHS ABS: 2.1 10*3/uL (ref 0.7–4.0)
Lymphocytes Relative: 17 %
MCH: 27.1 pg (ref 26.0–34.0)
MCHC: 32.1 g/dL (ref 30.0–36.0)
MCV: 84.5 fL (ref 78.0–100.0)
MONO ABS: 1.3 10*3/uL — AB (ref 0.1–1.0)
MONOS PCT: 10 %
NEUTROS PCT: 73 %
Neutro Abs: 9 10*3/uL — ABNORMAL HIGH (ref 1.7–7.7)
Platelets: 294 10*3/uL (ref 150–400)
RBC: 5.09 MIL/uL (ref 3.87–5.11)
RDW: 14.6 % (ref 11.5–15.5)
WBC: 12.5 10*3/uL — ABNORMAL HIGH (ref 4.0–10.5)

## 2016-06-11 LAB — COMPREHENSIVE METABOLIC PANEL
ALK PHOS: 110 U/L (ref 38–126)
ALT: 16 U/L (ref 14–54)
ANION GAP: 11 (ref 5–15)
AST: 18 U/L (ref 15–41)
Albumin: 3.6 g/dL (ref 3.5–5.0)
BILIRUBIN TOTAL: 0.6 mg/dL (ref 0.3–1.2)
BUN: 24 mg/dL — ABNORMAL HIGH (ref 6–20)
CALCIUM: 9.9 mg/dL (ref 8.9–10.3)
CO2: 28 mmol/L (ref 22–32)
Chloride: 100 mmol/L — ABNORMAL LOW (ref 101–111)
Creatinine, Ser: 0.79 mg/dL (ref 0.44–1.00)
GFR calc non Af Amer: >60 mL/min (ref >60)
Glucose, Bld: 112 mg/dL — ABNORMAL HIGH (ref 65–99)
POTASSIUM: 3.9 mmol/L (ref 3.5–5.1)
SODIUM: 139 mmol/L (ref 135–145)
TOTAL PROTEIN: 7.1 g/dL (ref 6.5–8.1)

## 2016-06-11 LAB — PROTIME-INR
INR: 1.1
Prothrombin Time: 14.3 seconds (ref 11.4–15.2)

## 2016-06-11 MED ORDER — ONDANSETRON HCL 4 MG/2ML IJ SOLN
INTRAMUSCULAR | Status: AC
  Filled 2016-06-11: qty 2

## 2016-06-11 MED ORDER — MIDAZOLAM HCL 2 MG/2ML IJ SOLN
INTRAMUSCULAR | Status: AC | PRN
  Administered 2016-06-11: 1 mg via INTRAVENOUS
  Administered 2016-06-11 (×2): 0.5 mg via INTRAVENOUS

## 2016-06-11 MED ORDER — FENTANYL CITRATE (PF) 100 MCG/2ML IJ SOLN
INTRAMUSCULAR | Status: AC | PRN
  Administered 2016-06-11 (×2): 25 ug via INTRAVENOUS
  Administered 2016-06-11: 50 ug via INTRAVENOUS

## 2016-06-11 MED ORDER — HYDROCODONE-ACETAMINOPHEN 5-325 MG PO TABS: 1.0000 | ORAL_TABLET | ORAL | Status: DC | PRN

## 2016-06-11 MED ORDER — ONDANSETRON 8 MG PO TBDP: 8.0000 mg | ORAL_TABLET | Freq: Three times a day (TID) | ORAL | 1 refills | Status: AC | PRN

## 2016-06-11 MED ORDER — FLUMAZENIL 0.5 MG/5ML IV SOLN
INTRAVENOUS | Status: AC
  Filled 2016-06-11: qty 5

## 2016-06-11 MED ORDER — MIDAZOLAM HCL 2 MG/2ML IJ SOLN
INTRAMUSCULAR | Status: AC
  Filled 2016-06-11: qty 4

## 2016-06-11 MED ORDER — SODIUM CHLORIDE 0.9 % IV SOLN
INTRAVENOUS | Status: DC
  Administered 2016-06-11 (×2): via INTRAVENOUS

## 2016-06-11 MED ORDER — ONDANSETRON HCL 4 MG/2ML IJ SOLN
INTRAMUSCULAR | Status: AC | PRN
  Administered 2016-06-11: 4 mg via INTRAVENOUS

## 2016-06-11 MED ORDER — NALOXONE HCL 0.4 MG/ML IJ SOLN
INTRAMUSCULAR | Status: AC
  Filled 2016-06-11: qty 1

## 2016-06-11 MED ORDER — FENTANYL CITRATE (PF) 100 MCG/2ML IJ SOLN
INTRAMUSCULAR | Status: AC
  Filled 2016-06-11: qty 4

## 2016-06-11 NOTE — H&P (Signed)
Chief Complaint: Patient was seen in consultation today for biopsy of liver lesion at the request of Shadad,Firas N  Referring Physician(s): Wyatt Portela  Supervising Physician: Jacqulynn Cadet  Patient Status: [facility] - Out-pt  History of Present Illness: Victoria Castro is a 76 y.o. female with metastatic melanoma. She had a recent PET scan showing concern for disease progression. She is referred for biopsy of a hypermetabolic liver lesion. PMHx, meds, labs, imaging, allergies reviewed. Has been NPO today Family at bedisde  Past Medical History:  Diagnosis Date  . HTN (hypertension)   . Melanoma ([place])   . Protein calorie malnutrition ([place])     History reviewed. No pertinent surgical history.  Allergies: Patient has no known allergies.  Medications: Prior to Admission medications   Medication Sig Start Date End Date Taking? Authorizing Provider  amLODipine-benazepril (LOTREL) 5-20 MG per capsule Take 1 capsule by mouth daily.   Yes Historical Provider, MD  Cholecalciferol (VITAMIN D) 2000 UNITS tablet Take 2,000 Units by mouth daily.   Yes Historical Provider, MD  levothyroxine (SYNTHROID, LEVOTHROID) 25 MCG tablet Take 25 mcg by mouth daily before breakfast. 05/29/16  Yes Historical Provider, MD  megestrol (MEGACE) 400 MG/10ML suspension Take 10 mLs (400 mg total) by mouth 2 (two) times daily. 04/22/16  Yes Wyatt Portela, MD  mirtazapine (REMERON) 15 MG tablet Take 1 tablet (15 mg total) by mouth at bedtime. 05/29/16  Yes Wyatt Portela, MD  predniSONE (DELTASONE) 10 MG tablet Take as directed in ipilimumab Curt Bears) instructions. 02/25/16   Wyatt Portela, MD     History reviewed. No pertinent family history.  Social History   Social History  . Marital status: Widowed    Spouse name: N/A  . Number of children: N/A  . Years of education: N/A   Social History Main Topics  . Smoking status: Former Smoker    Packs/day: 0.50    Years: 18.00    Types: Cigarettes    Start date: 09/21/1956    Quit date: 02/21/1973  . Smokeless tobacco: Never Used     Comment: quit 40 years ago  . Alcohol use None  . Drug use: Unknown  . Sexual activity: Not Asked   Other Topics Concern  . None   Social History Narrative  . None     Review of Systems: A 12 point ROS discussed and pertinent positives are indicated in the HPI above.  All other systems are negative.  Review of Systems  Vital Signs: BP (!) 114/55 (BP Location: Right Arm)   Pulse (!) 111   Temp 97.6 F (36.4 C) (Oral)   Resp 16   SpO2 97%   Physical Exam  Constitutional: She is oriented to person, place, and time. She appears well-developed. No distress.  Thin elderly female  HENT:  Head: Normocephalic.  Mouth/Throat: Oropharynx is clear and moist.  Neck: Normal range of motion. No tracheal deviation present.  Cardiovascular: Normal rate, regular rhythm and normal heart sounds.   Pulmonary/Chest: Effort normal and breath sounds normal. No respiratory distress.  Abdominal: Soft. She exhibits no distension and no mass. There is no tenderness. There is no guarding.  Neurological: She is alert and oriented to person, place, and time.  Skin: Skin is warm and dry.  Psychiatric: She has a normal mood and affect. Judgment normal.      Mallampati Score:  MD Evaluation Airway: WNL Heart: WNL Abdomen: WNL Chest/ Lungs: WNL ASA  Classification: 3 Mallampati/Airway Score: One  Imaging: Nm Pet Image Initial (pi) Whole Body  Result Date: 05/28/2016 CLINICAL DATA:  Initial treatment strategy for head and neck melanoma. Undergoing immunotherapy. Previous history of right lung carcinoma. EXAM: NUCLEAR MEDICINE PET WHOLE BODY TECHNIQUE: 5.8 mCi F-18 FDG was injected intravenously. Full-ring PET imaging was performed from the vertex to the feet after the radiotracer. CT data was obtained and used for attenuation correction and anatomic localization. FASTING BLOOD GLUCOSE:  Value: 114 mg/dl  COMPARISON:  Chest CT on 02/21/2014 FINDINGS: HEAD/NECK No hypermetabolic activity in the scalp. Postop changes from previous right neck dissection. No hypermetabolic cervical lymph nodes. Thyroid gland is diffusely enlarged and shows diffuse FDG uptake. No focal thyroid nodule identified. CHEST Hypermetabolic mediastinal lymphadenopathy seen in lateral aortic region measuring 3.2 cm, with SUV max of 24.6. Hypermetabolic bilateral hilar lymphadenopathy is also seen. On the right, this measures 2.8 cm on image 92/4, with SUV max of 17.7. On the left, this measures 2.9 cm on image 92/4, with SUV max of 29.3. A solid lobulated pulmonary nodule is seen in the posterior left lower lobe which is new since previous CT, with SUV max of 10.9. 5 mm pulmonary nodule in the lateral left lower lobe on image 53/7 is stable since 2015 and shows no hypermetabolic activity, consistent with benign etiology. A 13 mm hypermetabolic soft tissue density is seen in the medial right breast adjacent to surgical clips. This is new since previous study and is hypermetabolic with SUV max of 8.8. ABDOMEN/PELVIS Several hypermetabolic lesions are seen in the liver, which are not visualized or measurable on noncontrast CT images, better consistent liver metastases. Index lesion in the left lobe has SUV max of 23.4, and index lesion in the posterior right hepatic lobe has SUV max of 14.9. Hypermetabolic soft tissue density involving the medial aspect of the spleen measures 2.6 x 1.8 cm on image 121/4, and has SUV max of 13.0. Hypermetabolic lymphadenopathy is also seen in the area the gastrohepatic ligament along the posterior margin of the left lobe, largest measuring 11 mm short axis on image 127/4, with SUV max of 10.9. 9 mm hypermetabolic lymph node is seen in the left common iliac chain, which has SUV max of 6.5. Hypermetabolic subcutaneous soft tissue nodule is seen in the anteromedial left buttocks, which measures 1.3 x 0.8 cm on image  213/4, and has SUV max of 10.2. Left renal parenchymal scarring noted. 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 Aortic atherosclerosis. SKELETON No focal hypermetabolic activity to suggest skeletal metastasis. EXTREMITIES No abnormal hypermetabolic activity in the lower extremities. IMPRESSION: Hypermetabolic lymphadenopathy in bilateral hilar regions, left mediastinum, gastrohepatic ligament, and left common iliac chain, consistent with metastatic disease. 1.8 cm hypermetabolic left lower lobe pulmonary nodule, consistent with pulmonary metastasis. Several hypermetabolic liver metastases, and hypermetabolic metastasis involving the medial aspect of the spleen. Hypermetabolic soft tissue nodules in medial right breast and left buttock, consistent with soft tissue metastases. Electronically Signed   By: Earle Gell M.D.   On: 05/28/2016 11:30    Labs:  CBC:  Recent Labs  03/17/16 0913 04/22/16 0749 05/28/16 1040 06/11/16 1119  WBC 8.8 10.1 11.8* 12.5*  HGB 15.3 14.2 14.3 13.8  HCT 46.8* 43.5 43.7 43.0  PLT 182 291 315 294    COAGS:  Recent Labs  06/11/16 1119  INR 1.10    BMP:  Recent Labs  03/17/16 0913 04/22/16 0749 05/28/16 1040 06/11/16  1119  NA 140 139 138 139  K 4.0 3.7 3.7 3.9  CL  --   --   --  100*  CO2 24 27 21* 28  GLUCOSE 135 113 101 112*  BUN 14.4 14.3 19.3 24*  CALCIUM 10.5* 10.5* 10.9* 9.9  CREATININE 0.7 0.7 0.7 0.79  GFRNONAA  --   --   --  >60  GFRAA  --   --   --  >60    LIVER FUNCTION TESTS:  Recent Labs  03/17/16 0913 04/22/16 0749 05/28/16 1040 06/11/16 1119  BILITOT 0.38 0.57 0.94 0.6  AST '12 13 10 18  '$ ALT '15 13 9 16  '$ ALKPHOS 80 116 129 110  PROT 7.7 7.6 7.9 7.1  ALBUMIN 3.8 3.2* 3.3* 3.6    TUMOR MARKERS: No results for input(s): AFPTM, CEA, CA199, CHROMGRNA in the last 8760 hours.  Assessment and Plan: Metastatic melanoma For US guided biopsy of  hypermetabolic liver lesion. Labs ok Risks and Benefits discussed with the patient including, but not limited to bleeding, infection, damage to adjacent structures or low yield requiring additional tests. All of the patient's questions were answered, patient is agreeable to proceed. Consent signed and in chart.    Thank you for this interesting consult.  I greatly enjoyed meeting Victoria Castro and look forward to participating in their care.  A copy of this report was sent to the requesting provider on this date.  Electronically Signed: Ascencion Dike 06/11/2016, 12:31 PM   I spent a total of 20 minutes in face to face in clinical consultation, greater than 50% of which was counseling/coordinating care for liver biopsy

## 2016-06-11 NOTE — Procedures (Signed)
Interventional Radiology Procedure Note  Procedure: US guided core biopsy of liver lesion  Complications: None  Estimated Blood Loss: 0  Recommendations: - Bedrest x 3 hrs - DC home - Path pending  Signed,  Criselda Peaches, MD

## 2016-06-11 NOTE — Discharge Instructions (Signed)
Liver Biopsy, Care After Introduction These instructions give you information on caring for yourself after your procedure. Your doctor may also give you more specific instructions. Call your doctor if you have any problems or questions after your procedure. Follow these instructions at home:  Rest at home for 1-2 days or as told by your doctor.  Have someone stay with you for at least 24 hours.  Do not do these things in the first 24 hours:  Drive.  Use machinery.  Take care of other people.  Sign legal documents.  Take a bath or shower.  There are many different ways to close and cover a cut (incision). For example, a cut can be closed with stitches, skin glue, or adhesive strips. Follow your doctor's instructions on:  You may remove the bandage after 24hrs. OK to shower after 24hrs.  May leave open to air or cover with bandaid if needed.  Do not drink alcohol in the first week.  Do not lift more than 5 pounds or play contact sports for the first 2 weeks.  Take medicines only as told by your doctor. For 1 week, do not take medicine that has aspirin in it or medicines like ibuprofen.  Get your test results. Contact a doctor if:  A cut bleeds and leaves more than just a small spot of blood.  A cut is red, puffs up (swells), or hurts more than before.  Fluid or something else comes from a cut.  A cut smells bad.  You have a fever or chills. Get help right away if:  You have swelling, bloating, or pain in your belly (abdomen).  You get dizzy or faint.  You have a rash.  You feel sick to your stomach (nauseous) or throw up (vomit).  You have trouble breathing, feel short of breath, or feel faint.  Your chest hurts.  You have problems talking or seeing.  You have trouble balancing or moving your arms or legs. This information is not intended to replace advice given to you by your health care provider. Make sure you discuss any questions you have with your health  care provider. Document Released: 02/11/2008 Document Revised: 10/10/2015 Document Reviewed: 06/30/2013  2017 Elsevier Moderate Conscious Sedation, Adult, Care After These instructions provide you with information about caring for yourself after your procedure. Your health care provider may also give you more specific instructions. Your treatment has been planned according to current medical practices, but problems sometimes occur. Call your health care provider if you have any problems or questions after your procedure. What can I expect after the procedure? After your procedure, it is common:  To feel sleepy for several hours.  To feel clumsy and have poor balance for several hours.  To have poor judgment for several hours.  To vomit if you eat too soon. Follow these instructions at home: For at least 24 hours after the procedure:   Do not:  Participate in activities where you could fall or become injured.  Drive.  Use heavy machinery.  Drink alcohol.  Take sleeping pills or medicines that cause drowsiness.  Make important decisions or sign legal documents.  Take care of children on your own.  Rest. Eating and drinking  Follow the diet recommended by your health care provider.  If you vomit:  Drink water, juice, or soup when you can drink without vomiting.  Make sure you have little or no nausea before eating solid foods. General instructions  Have a responsible adult stay  with you until you are awake and alert.  Take over-the-counter and prescription medicines only as told by your health care provider.  If you smoke, do not smoke without supervision.  Keep all follow-up visits as told by your health care provider. This is important. Contact a health care provider if:  You keep feeling nauseous or you keep vomiting.  You feel light-headed.  You develop a rash.  You have a fever. Get help right away if:  You have trouble breathing. This information  is not intended to replace advice given to you by your health care provider. Make sure you discuss any questions you have with your health care provider. Document Released: 02/22/2013 Document Revised: 10/07/2015 Document Reviewed: 08/24/2015 Elsevier Interactive Patient Education  2017 [employer].

## 2016-06-17 ENCOUNTER — Telehealth: Payer: Self-pay | Admitting: *Deleted

## 2016-06-17 ENCOUNTER — Encounter: Payer: Self-pay | Admitting: Nurse Practitioner

## 2016-06-17 ENCOUNTER — Telehealth: Payer: Self-pay | Admitting: Oncology

## 2016-06-17 ENCOUNTER — Encounter: Payer: Self-pay | Admitting: Gastroenterology

## 2016-06-17 ENCOUNTER — Ambulatory Visit (HOSPITAL_BASED_OUTPATIENT_CLINIC_OR_DEPARTMENT_OTHER): Payer: Medicare Other | Admitting: Oncology

## 2016-06-17 ENCOUNTER — Ambulatory Visit (HOSPITAL_BASED_OUTPATIENT_CLINIC_OR_DEPARTMENT_OTHER): Payer: Medicare Other | Admitting: Nurse Practitioner

## 2016-06-17 VITALS — BP 154/79 | HR 114 | Temp 98.6°F | Resp 16 | Ht 63.0 in | Wt 103.5 lb

## 2016-06-17 DIAGNOSIS — E860 Dehydration: Secondary | ICD-10-CM | POA: Diagnosis not present

## 2016-06-17 DIAGNOSIS — C430 Malignant melanoma of lip: Secondary | ICD-10-CM

## 2016-06-17 DIAGNOSIS — C7800 Secondary malignant neoplasm of unspecified lung: Secondary | ICD-10-CM | POA: Diagnosis not present

## 2016-06-17 DIAGNOSIS — C787 Secondary malignant neoplasm of liver and intrahepatic bile duct: Secondary | ICD-10-CM

## 2016-06-17 DIAGNOSIS — C3401 Malignant neoplasm of right main bronchus: Secondary | ICD-10-CM

## 2016-06-17 DIAGNOSIS — E669 Obesity, unspecified: Secondary | ICD-10-CM

## 2016-06-17 DIAGNOSIS — R1013 Epigastric pain: Secondary | ICD-10-CM

## 2016-06-17 DIAGNOSIS — R5383 Other fatigue: Secondary | ICD-10-CM

## 2016-06-17 DIAGNOSIS — C439 Malignant melanoma of skin, unspecified: Secondary | ICD-10-CM

## 2016-06-17 MED ORDER — SODIUM CHLORIDE 0.9 % IV SOLN
Freq: Once | INTRAVENOUS | Status: DC
  Administered 2016-06-17: 15:00:00 via INTRAVENOUS

## 2016-06-17 MED ORDER — SODIUM CHLORIDE 0.9 % IV SOLN: Freq: Once | INTRAVENOUS | Status: DC

## 2016-06-17 MED ORDER — ONDANSETRON HCL 4 MG/2ML IJ SOLN
INTRAMUSCULAR | Status: AC
  Filled 2016-06-17: qty 2

## 2016-06-17 MED ORDER — ONDANSETRON HCL 4 MG/2ML IJ SOLN: 8.0000 mg | Freq: Once | INTRAMUSCULAR | Status: DC

## 2016-06-17 NOTE — Progress Notes (Signed)
Hematology and Oncology Follow Up Visit  Victoria Castro [id number] 1940-10-24 76 y.o. 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 She developed subsequently cervical adenopathy in June 2017. Her final pathological staging was IIIB ([id number].    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 She underwent Mohs procedure in February 2017 which did not show any residual malignancy at that time.  She subsequently started developing a tender mass in her right submandibular region which did not improve with antibiotics. Evaluate her aspiration done on 09/16/2015 showed atypical cells suspicious for malignancy.  PET scan showed right-sided lymphadenopathy at the level of IIa. She subsequently underwent a repeat biopsy on 10/02/2015 which confirmed the presence of metastatic melanoma. She is status post neck dissection performed on 10/22/2015 which showed 2 out of 7 lymph nodes involved with metastatic melanoma. Adjuvant ipilimumab at reduced dose of 3 mg/kg every 3 weeks for a total of 4 doses. She is supposed 4 treatments completed in 03/17/2016.  Current therapy: Supportive management.  Interim History: Victoria Castro presents today for a follow-up visit. Since the last visit, she reports few changes since last visit. She has noticed poor by mouth intake and continue to have issues with failure to thrive. She reports her main concern is inability to eat. She had problems with dyspepsia and pain after eating. She feels that she is urged to eat but for to exacerbate nausea and abdominal pain. Because of that, she has had issues with dehydration and overall have been fatigued. She is able to perform activities of daily living but has been limited because of her declining.  She underwent liver biopsy and  tolerated the procedure well. She denied any further abdominal pain or discomfort. She does not report any flank pain or hematuria. Did not report any dysuria.  She is not report any headaches, blurry vision, syncope or seizures. She does not report any fevers or chills or sweats. She does not report any wheezing or hemoptysis. She does not report any chest pain, palpitation, orthopnea or leg edema. She does not report any frequency urgency or hesitancy. She does not report any skeletal complaints. Remaining review of systems unremarkable.   Medications: I have reviewed the patient's current medications.  Current Outpatient Prescriptions  Medication Sig Dispense Refill  . amLODipine-benazepril (LOTREL) 5-20 MG per capsule Take 1 capsule by mouth daily.    . Cholecalciferol (VITAMIN D) 2000 UNITS tablet Take 2,000 Units by mouth daily.    Marland Kitchen levothyroxine (SYNTHROID, LEVOTHROID) 25 MCG tablet Take 25 mcg by mouth daily before breakfast.    . megestrol (MEGACE) 400 MG/10ML suspension Take 10 mLs (400 mg total) by mouth 2 (two) times daily. 240 mL 0  . mirtazapine (REMERON) 15 MG tablet Take 1 tablet (15 mg total) by mouth at bedtime. 30 tablet 1  . ondansetron (ZOFRAN ODT) 8 MG disintegrating tablet Take 1 tablet (8 mg total) by mouth every 8 (eight) hours as needed for nausea or vomiting. 30 tablet 1  . predniSONE (DELTASONE) 10 MG tablet Take as directed in ipilimumab Victoria Castro) instructions. 120 tablet 0   Current Facility-Administered Medications  Medication Dose Route Frequency Provider Last Rate Last Dose  . 0.9 %  sodium chloride infusion   Intravenous Once Victoria Portela, MD      . ondansetron (  ZOFRAN) 8 mg in sodium chloride 0.9 % 50 mL IVPB   Intravenous Once Victoria Portela, MD         Allergies: No Known Allergies  Past Medical History, Surgical history, Social history, and Family History were reviewed and updated.   Physical Exam: Blood pressure (!) 154/79, pulse (!) 114,  temperature 98.6 F (37 C), temperature source Oral, resp. rate 16, height 5' 3"  (1.6 m), weight 103 lb 8 oz (46.9 kg), SpO2 99 %. ECOG: 1 General appearance:  Alert, awake woman appeared chronically ill. Not in any distress today. Head: Normocephalic, without obvious abnormality no oral thrush noted. Mucous membranes are dry. Neck: no adenopathy Lymph nodes: Cervical, supraclavicular, and axillary nodes normal. Heart:regular rate and rhythm, S1, S2 normal, no murmur, click, rub or gallop Lung:chest clear, no wheezing, rales, normal symmetric air entry.  Abdomin: soft, non-tender, without masses or organomegaly no shifting dullness or ascites. EXT:no erythema, induration, or nodules   Lab Results: Lab Results  Component Value Date   WBC 12.5 (H) 06/11/2016   HGB 13.8 06/11/2016   HCT 43.0 06/11/2016   MCV 84.5 06/11/2016   PLT 294 06/11/2016     Chemistry      Component Value Date/Time   NA 139 06/11/2016 1119   NA 138 05/28/2016 1040   K 3.9 06/11/2016 1119   K 3.7 05/28/2016 1040   CL 100 (L) 06/11/2016 1119   CL 100 02/21/2014 0902   CO2 28 06/11/2016 1119   CO2 21 (L) 05/28/2016 1040   BUN 24 (H) 06/11/2016 1119   BUN 19.3 05/28/2016 1040   CREATININE 0.79 06/11/2016 1119   CREATININE 0.7 05/28/2016 1040      Component Value Date/Time   CALCIUM 9.9 06/11/2016 1119   CALCIUM 10.9 (H) 05/28/2016 1040   ALKPHOS 110 06/11/2016 1119   ALKPHOS 129 05/28/2016 1040   AST 18 06/11/2016 1119   AST 10 05/28/2016 1040   ALT 16 06/11/2016 1119   ALT 9 05/28/2016 1040   BILITOT 0.6 06/11/2016 1119   BILITOT 0.94 05/28/2016 1040      Impression and Plan:  76 year old woman with the following issues:  1. 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 She was initially found to have a lesion on the right upper lip and subsequently developed cervical adenopathy with the final pathological staging as IIIB (TisN2aM0). She is status post Mohs procedure  followed by neck dissection completed in June 2017.  She is S/P adjuvant therapy utilizing ipilimumab at 3 mg/kg every 3 weeks. She is status post 4 treatments completed without acute toxicities. She did develop delayed toxicity including hypothyroidism which she is currently being treated for.  PET CT scan obtained on 05/28/2016 showed evidence of metastatic disease in the liver as well as in the lung.   The biopsy from her liver mass confirmed the presence of melanoma but her BRAF testing is currently pending. It is possible that her tumor is BRAF mutated tumor and likely will respond with targeted therapy. If it's not, second line immunotherapy with Pembrolizumab is the treatment of choice.   2. Dyspepsia, poor by mouth intake and malnutrition: These are recent findings of very worrisome for gastric pathology. Peptic ulcer disease or possible tumor involvement would be a possibility.  I have recommended a referral for gastroenterology for an endoscopy. In the meantime, I will arrange for her IV hydration aggressively in the next 7-10 days with progressive nausea management in the interim.  It would  be reasonable to be aggressive in treating reversible causes of her poor nutrition.  3. Hypercalcemia: Related to malignancy. She is status post Zometa on January 11 and her calcium corrected on January 25.   4. Prognosis: She understands that she is an incurable malignancy with poor prognosis. She is willing to try any modality to palliate her symptoms at this time which include IV hydration, endoscopy on possible anticancer treatment.  5. Follow-up: Will be 06/29/2016 to follow on her status and discuss treatment options at that time.   Zola Button, MD 1/31/20182:02 PM

## 2016-06-17 NOTE — Patient Instructions (Signed)
Dehydration, Adult Dehydration is a condition in which there is not enough fluid or water in the body. This happens when you lose more fluids than you take in. Important organs, such as the kidneys, brain, and heart, cannot function without a proper amount of fluids. Any loss of fluids from the body can lead to dehydration. Dehydration can range from mild to severe. This condition should be treated right away to prevent it from becoming severe. What are the causes? This condition may be caused by:  Vomiting.  Diarrhea.  Excessive sweating, such as from heat exposure or exercise.  Not drinking enough fluid, especially:  When ill.  While doing activity that requires a lot of energy.  Excessive urination.  Fever.  Infection.  Certain medicines, such as medicines that cause the body to lose excess fluid (diuretics).  Inability to access safe drinking water.  Reduced physical ability to get adequate water and food. What increases the risk? This condition is more likely to develop in people:  Who have a poorly controlled long-term (chronic) illness, such as diabetes, heart disease, or kidney disease.  Who are age 65 or older.  Who are disabled.  Who live in a place with high altitude.  Who play endurance sports. What are the signs or symptoms? Symptoms of mild dehydration may include:   Thirst.  Dry lips.  Slightly dry mouth.  Dry, warm skin.  Dizziness. Symptoms of moderate dehydration may include:   Very dry mouth.  Muscle cramps.  Dark urine. Urine may be the color of tea.  Decreased urine production.  Decreased tear production.  Heartbeat that is irregular or faster than normal (palpitations).  Headache.  Light-headedness, especially when you stand up from a sitting position.  Fainting (syncope). Symptoms of severe dehydration may include:   Changes in skin, such as:  Cold and clammy skin.  Blotchy (mottled) or pale skin.  Skin that does  not quickly return to normal after being lightly pinched and released (poor skin turgor).  Changes in body fluids, such as:  Extreme thirst.  No tear production.  Inability to sweat when body temperature is high, such as in hot weather.  Very little urine production.  Changes in vital signs, such as:  Weak pulse.  Pulse that is more than 100 beats a minute when sitting still.  Rapid breathing.  Low blood pressure.  Other changes, such as:  Sunken eyes.  Cold hands and feet.  Confusion.  Lack of energy (lethargy).  Difficulty waking up from sleep.  Short-term weight loss.  Unconsciousness. How is this diagnosed? This condition is diagnosed based on your symptoms and a physical exam. Blood and urine tests may be done to help confirm the diagnosis. How is this treated? Treatment for this condition depends on the severity. Mild or moderate dehydration can often be treated at home. Treatment should be started right away. Do not wait until dehydration becomes severe. Severe dehydration is an emergency and it needs to be treated in a hospital. Treatment for mild dehydration may include:   Drinking more fluids.  Replacing salts and minerals in your blood (electrolytes) that you may have lost. Treatment for moderate dehydration may include:   Drinking an oral rehydration solution (ORS). This is a drink that helps you replace fluids and electrolytes (rehydrate). It can be found at pharmacies and retail stores. Treatment for severe dehydration may include:   Receiving fluids through an IV tube.  Receiving an electrolyte solution through a feeding tube that is   passed through your nose and into your stomach (nasogastric tube, or NG tube).  Correcting any abnormalities in electrolytes.  Treating the underlying cause of dehydration. Follow these instructions at home:  If directed by your health care provider, drink an ORS:  Make an ORS by following instructions on the  package.  Start by drinking small amounts, about  cup (120 mL) every 5-10 minutes.  Slowly increase how much you drink until you have taken the amount recommended by your health care provider.  Drink enough clear fluid to keep your urine clear or pale yellow. If you were told to drink an ORS, finish the ORS first, then start slowly drinking other clear fluids. Drink fluids such as:  Water. Do not drink only water. Doing that can lead to having too little salt (sodium) in the body (hyponatremia).  Ice chips.  Fruit juice that you have added water to (diluted fruit juice).  Low-calorie sports drinks.  Avoid:  Alcohol.  Drinks that contain a lot of sugar. These include high-calorie sports drinks, fruit juice that is not diluted, and soda.  Caffeine.  Foods that are greasy or contain a lot of fat or sugar.  Take over-the-counter and prescription medicines only as told by your health care provider.  Do not take sodium tablets. This can lead to having too much sodium in the body (hypernatremia).  Eat foods that contain a healthy balance of electrolytes, such as bananas, oranges, potatoes, tomatoes, and spinach.  Keep all follow-up visits as told by your health care provider. This is important. Contact a health care provider if:  You have abdominal pain that:  Gets worse.  Stays in one area (localizes).  You have a rash.  You have a stiff neck.  You are more irritable than usual.  You are sleepier or more difficult to wake up than usual.  You feel weak or dizzy.  You feel very thirsty.  You have urinated only a small amount of very dark urine over 6-8 hours. Get help right away if:  You have symptoms of severe dehydration.  You cannot drink fluids without vomiting.  Your symptoms get worse with treatment.  You have a fever.  You have a severe headache.  You have vomiting or diarrhea that:  Gets worse.  Does not go away.  You have blood or green matter  (bile) in your vomit.  You have blood in your stool. This may cause stool to look black and tarry.  You have not urinated in 6-8 hours.  You faint.  Your heart rate while sitting still is over 100 beats a minute.  You have trouble breathing. This information is not intended to replace advice given to you by your health care provider. Make sure you discuss any questions you have with your health care provider. Document Released: 05/04/2005 Document Revised: 11/29/2015 Document Reviewed: 06/28/2015 Elsevier Interactive Patient Education  2017 [employer]  

## 2016-06-17 NOTE — Telephone Encounter (Signed)
Per 1/31 LOS and staff message I have scheduled appts and notified the scheduler

## 2016-06-17 NOTE — Progress Notes (Signed)
RN visit only. 

## 2016-06-17 NOTE — Telephone Encounter (Signed)
Pt confirmed appt and received avs °

## 2016-06-18 ENCOUNTER — Ambulatory Visit (HOSPITAL_BASED_OUTPATIENT_CLINIC_OR_DEPARTMENT_OTHER): Payer: Medicare Other

## 2016-06-18 VITALS — BP 137/67 | HR 103 | Temp 98.1°F | Resp 16

## 2016-06-18 DIAGNOSIS — C7800 Secondary malignant neoplasm of unspecified lung: Secondary | ICD-10-CM

## 2016-06-18 DIAGNOSIS — C430 Malignant melanoma of lip: Secondary | ICD-10-CM

## 2016-06-18 DIAGNOSIS — C439 Malignant melanoma of skin, unspecified: Secondary | ICD-10-CM

## 2016-06-18 DIAGNOSIS — E860 Dehydration: Secondary | ICD-10-CM

## 2016-06-18 DIAGNOSIS — C787 Secondary malignant neoplasm of liver and intrahepatic bile duct: Secondary | ICD-10-CM | POA: Diagnosis not present

## 2016-06-18 MED ORDER — SODIUM CHLORIDE 0.9 % IV SOLN
Freq: Once | INTRAVENOUS | Status: AC
  Administered 2016-06-18: 09:00:00 via INTRAVENOUS

## 2016-06-18 NOTE — Patient Instructions (Signed)
Dehydration, Adult Dehydration is a condition in which there is not enough fluid or water in the body. This happens when you lose more fluids than you take in. Important organs, such as the kidneys, brain, and heart, cannot function without a proper amount of fluids. Any loss of fluids from the body can lead to dehydration. Dehydration can range from mild to severe. This condition should be treated right away to prevent it from becoming severe. What are the causes? This condition may be caused by:  Vomiting.  Diarrhea.  Excessive sweating, such as from heat exposure or exercise.  Not drinking enough fluid, especially:  When ill.  While doing activity that requires a lot of energy.  Excessive urination.  Fever.  Infection.  Certain medicines, such as medicines that cause the body to lose excess fluid (diuretics).  Inability to access safe drinking water.  Reduced physical ability to get adequate water and food. What increases the risk? This condition is more likely to develop in people:  Who have a poorly controlled long-term (chronic) illness, such as diabetes, heart disease, or kidney disease.  Who are age 65 or older.  Who are disabled.  Who live in a place with high altitude.  Who play endurance sports. What are the signs or symptoms? Symptoms of mild dehydration may include:   Thirst.  Dry lips.  Slightly dry mouth.  Dry, warm skin.  Dizziness. Symptoms of moderate dehydration may include:   Very dry mouth.  Muscle cramps.  Dark urine. Urine may be the color of tea.  Decreased urine production.  Decreased tear production.  Heartbeat that is irregular or faster than normal (palpitations).  Headache.  Light-headedness, especially when you stand up from a sitting position.  Fainting (syncope). Symptoms of severe dehydration may include:   Changes in skin, such as:  Cold and clammy skin.  Blotchy (mottled) or pale skin.  Skin that does  not quickly return to normal after being lightly pinched and released (poor skin turgor).  Changes in body fluids, such as:  Extreme thirst.  No tear production.  Inability to sweat when body temperature is high, such as in hot weather.  Very little urine production.  Changes in vital signs, such as:  Weak pulse.  Pulse that is more than 100 beats a minute when sitting still.  Rapid breathing.  Low blood pressure.  Other changes, such as:  Sunken eyes.  Cold hands and feet.  Confusion.  Lack of energy (lethargy).  Difficulty waking up from sleep.  Short-term weight loss.  Unconsciousness. How is this diagnosed? This condition is diagnosed based on your symptoms and a physical exam. Blood and urine tests may be done to help confirm the diagnosis. How is this treated? Treatment for this condition depends on the severity. Mild or moderate dehydration can often be treated at home. Treatment should be started right away. Do not wait until dehydration becomes severe. Severe dehydration is an emergency and it needs to be treated in a hospital. Treatment for mild dehydration may include:   Drinking more fluids.  Replacing salts and minerals in your blood (electrolytes) that you may have lost. Treatment for moderate dehydration may include:   Drinking an oral rehydration solution (ORS). This is a drink that helps you replace fluids and electrolytes (rehydrate). It can be found at pharmacies and retail stores. Treatment for severe dehydration may include:   Receiving fluids through an IV tube.  Receiving an electrolyte solution through a feeding tube that is   passed through your nose and into your stomach (nasogastric tube, or NG tube).  Correcting any abnormalities in electrolytes.  Treating the underlying cause of dehydration. Follow these instructions at home:  If directed by your health care provider, drink an ORS:  Make an ORS by following instructions on the  package.  Start by drinking small amounts, about  cup (120 mL) every 5-10 minutes.  Slowly increase how much you drink until you have taken the amount recommended by your health care provider.  Drink enough clear fluid to keep your urine clear or pale yellow. If you were told to drink an ORS, finish the ORS first, then start slowly drinking other clear fluids. Drink fluids such as:  Water. Do not drink only water. Doing that can lead to having too little salt (sodium) in the body (hyponatremia).  Ice chips.  Fruit juice that you have added water to (diluted fruit juice).  Low-calorie sports drinks.  Avoid:  Alcohol.  Drinks that contain a lot of sugar. These include high-calorie sports drinks, fruit juice that is not diluted, and soda.  Caffeine.  Foods that are greasy or contain a lot of fat or sugar.  Take over-the-counter and prescription medicines only as told by your health care provider.  Do not take sodium tablets. This can lead to having too much sodium in the body (hypernatremia).  Eat foods that contain a healthy balance of electrolytes, such as bananas, oranges, potatoes, tomatoes, and spinach.  Keep all follow-up visits as told by your health care provider. This is important. Contact a health care provider if:  You have abdominal pain that:  Gets worse.  Stays in one area (localizes).  You have a rash.  You have a stiff neck.  You are more irritable than usual.  You are sleepier or more difficult to wake up than usual.  You feel weak or dizzy.  You feel very thirsty.  You have urinated only a small amount of very dark urine over 6-8 hours. Get help right away if:  You have symptoms of severe dehydration.  You cannot drink fluids without vomiting.  Your symptoms get worse with treatment.  You have a fever.  You have a severe headache.  You have vomiting or diarrhea that:  Gets worse.  Does not go away.  You have blood or green matter  (bile) in your vomit.  You have blood in your stool. This may cause stool to look black and tarry.  You have not urinated in 6-8 hours.  You faint.  Your heart rate while sitting still is over 100 beats a minute.  You have trouble breathing. This information is not intended to replace advice given to you by your health care provider. Make sure you discuss any questions you have with your health care provider. Document Released: 05/04/2005 Document Revised: 11/29/2015 Document Reviewed: 06/28/2015 Elsevier Interactive Patient Education  2017 [employer]  

## 2016-06-19 ENCOUNTER — Ambulatory Visit (HOSPITAL_BASED_OUTPATIENT_CLINIC_OR_DEPARTMENT_OTHER): Payer: Medicare Other

## 2016-06-19 VITALS — BP 119/54 | HR 99 | Temp 97.9°F | Resp 16

## 2016-06-19 DIAGNOSIS — C439 Malignant melanoma of skin, unspecified: Secondary | ICD-10-CM

## 2016-06-19 DIAGNOSIS — C7800 Secondary malignant neoplasm of unspecified lung: Secondary | ICD-10-CM

## 2016-06-19 DIAGNOSIS — C430 Malignant melanoma of lip: Secondary | ICD-10-CM | POA: Diagnosis not present

## 2016-06-19 DIAGNOSIS — R110 Nausea: Secondary | ICD-10-CM | POA: Diagnosis not present

## 2016-06-19 DIAGNOSIS — C787 Secondary malignant neoplasm of liver and intrahepatic bile duct: Secondary | ICD-10-CM

## 2016-06-19 MED ORDER — ONDANSETRON HCL 4 MG/2ML IJ SOLN
INTRAMUSCULAR | Status: AC
  Filled 2016-06-19: qty 4

## 2016-06-19 MED ORDER — ONDANSETRON HCL 4 MG/2ML IJ SOLN
8.0000 mg | Freq: Once | INTRAMUSCULAR | Status: AC
  Administered 2016-06-19: 8 mg via INTRAVENOUS

## 2016-06-19 MED ORDER — ONDANSETRON HCL 40 MG/20ML IJ SOLN: Freq: Once | INTRAMUSCULAR | Status: DC

## 2016-06-19 MED ORDER — SODIUM CHLORIDE 0.9 % IV SOLN
Freq: Once | INTRAVENOUS | Status: AC
  Administered 2016-06-19: 12:00:00 via INTRAVENOUS

## 2016-06-19 NOTE — Progress Notes (Signed)
Patient stable upon discharge.

## 2016-06-19 NOTE — Patient Instructions (Signed)
Dehydration, Adult Dehydration is a condition in which there is not enough fluid or water in the body. This happens when you lose more fluids than you take in. Important organs, such as the kidneys, brain, and heart, cannot function without a proper amount of fluids. Any loss of fluids from the body can lead to dehydration. Dehydration can range from mild to severe. This condition should be treated right away to prevent it from becoming severe. What are the causes? This condition may be caused by:  Vomiting.  Diarrhea.  Excessive sweating, such as from heat exposure or exercise.  Not drinking enough fluid, especially:  When ill.  While doing activity that requires a lot of energy.  Excessive urination.  Fever.  Infection.  Certain medicines, such as medicines that cause the body to lose excess fluid (diuretics).  Inability to access safe drinking water.  Reduced physical ability to get adequate water and food. What increases the risk? This condition is more likely to develop in people:  Who have a poorly controlled long-term (chronic) illness, such as diabetes, heart disease, or kidney disease.  Who are age 65 or older.  Who are disabled.  Who live in a place with high altitude.  Who play endurance sports. What are the signs or symptoms? Symptoms of mild dehydration may include:   Thirst.  Dry lips.  Slightly dry mouth.  Dry, warm skin.  Dizziness. Symptoms of moderate dehydration may include:   Very dry mouth.  Muscle cramps.  Dark urine. Urine may be the color of tea.  Decreased urine production.  Decreased tear production.  Heartbeat that is irregular or faster than normal (palpitations).  Headache.  Light-headedness, especially when you stand up from a sitting position.  Fainting (syncope). Symptoms of severe dehydration may include:   Changes in skin, such as:  Cold and clammy skin.  Blotchy (mottled) or pale skin.  Skin that does  not quickly return to normal after being lightly pinched and released (poor skin turgor).  Changes in body fluids, such as:  Extreme thirst.  No tear production.  Inability to sweat when body temperature is high, such as in hot weather.  Very little urine production.  Changes in vital signs, such as:  Weak pulse.  Pulse that is more than 100 beats a minute when sitting still.  Rapid breathing.  Low blood pressure.  Other changes, such as:  Sunken eyes.  Cold hands and feet.  Confusion.  Lack of energy (lethargy).  Difficulty waking up from sleep.  Short-term weight loss.  Unconsciousness. How is this diagnosed? This condition is diagnosed based on your symptoms and a physical exam. Blood and urine tests may be done to help confirm the diagnosis. How is this treated? Treatment for this condition depends on the severity. Mild or moderate dehydration can often be treated at home. Treatment should be started right away. Do not wait until dehydration becomes severe. Severe dehydration is an emergency and it needs to be treated in a hospital. Treatment for mild dehydration may include:   Drinking more fluids.  Replacing salts and minerals in your blood (electrolytes) that you may have lost. Treatment for moderate dehydration may include:   Drinking an oral rehydration solution (ORS). This is a drink that helps you replace fluids and electrolytes (rehydrate). It can be found at pharmacies and retail stores. Treatment for severe dehydration may include:   Receiving fluids through an IV tube.  Receiving an electrolyte solution through a feeding tube that is   passed through your nose and into your stomach (nasogastric tube, or NG tube).  Correcting any abnormalities in electrolytes.  Treating the underlying cause of dehydration. Follow these instructions at home:  If directed by your health care provider, drink an ORS:  Make an ORS by following instructions on the  package.  Start by drinking small amounts, about  cup (120 mL) every 5-10 minutes.  Slowly increase how much you drink until you have taken the amount recommended by your health care provider.  Drink enough clear fluid to keep your urine clear or pale yellow. If you were told to drink an ORS, finish the ORS first, then start slowly drinking other clear fluids. Drink fluids such as:  Water. Do not drink only water. Doing that can lead to having too little salt (sodium) in the body (hyponatremia).  Ice chips.  Fruit juice that you have added water to (diluted fruit juice).  Low-calorie sports drinks.  Avoid:  Alcohol.  Drinks that contain a lot of sugar. These include high-calorie sports drinks, fruit juice that is not diluted, and soda.  Caffeine.  Foods that are greasy or contain a lot of fat or sugar.  Take over-the-counter and prescription medicines only as told by your health care provider.  Do not take sodium tablets. This can lead to having too much sodium in the body (hypernatremia).  Eat foods that contain a healthy balance of electrolytes, such as bananas, oranges, potatoes, tomatoes, and spinach.  Keep all follow-up visits as told by your health care provider. This is important. Contact a health care provider if:  You have abdominal pain that:  Gets worse.  Stays in one area (localizes).  You have a rash.  You have a stiff neck.  You are more irritable than usual.  You are sleepier or more difficult to wake up than usual.  You feel weak or dizzy.  You feel very thirsty.  You have urinated only a small amount of very dark urine over 6-8 hours. Get help right away if:  You have symptoms of severe dehydration.  You cannot drink fluids without vomiting.  Your symptoms get worse with treatment.  You have a fever.  You have a severe headache.  You have vomiting or diarrhea that:  Gets worse.  Does not go away.  You have blood or green matter  (bile) in your vomit.  You have blood in your stool. This may cause stool to look black and tarry.  You have not urinated in 6-8 hours.  You faint.  Your heart rate while sitting still is over 100 beats a minute.  You have trouble breathing. This information is not intended to replace advice given to you by your health care provider. Make sure you discuss any questions you have with your health care provider. Document Released: 05/04/2005 Document Revised: 11/29/2015 Document Reviewed: 06/28/2015 Elsevier Interactive Patient Education  2017 [employer]  

## 2016-06-22 ENCOUNTER — Ambulatory Visit (HOSPITAL_BASED_OUTPATIENT_CLINIC_OR_DEPARTMENT_OTHER): Payer: Medicare Other

## 2016-06-22 ENCOUNTER — Other Ambulatory Visit: Payer: Self-pay | Admitting: *Deleted

## 2016-06-22 VITALS — BP 147/58 | HR 87 | Temp 97.4°F | Resp 18

## 2016-06-22 DIAGNOSIS — R110 Nausea: Secondary | ICD-10-CM | POA: Diagnosis not present

## 2016-06-22 DIAGNOSIS — C7800 Secondary malignant neoplasm of unspecified lung: Secondary | ICD-10-CM | POA: Diagnosis not present

## 2016-06-22 DIAGNOSIS — C787 Secondary malignant neoplasm of liver and intrahepatic bile duct: Secondary | ICD-10-CM | POA: Diagnosis not present

## 2016-06-22 DIAGNOSIS — C439 Malignant melanoma of skin, unspecified: Secondary | ICD-10-CM

## 2016-06-22 DIAGNOSIS — C430 Malignant melanoma of lip: Secondary | ICD-10-CM | POA: Diagnosis not present

## 2016-06-22 MED ORDER — SODIUM CHLORIDE 0.9 % IV SOLN
Freq: Once | INTRAVENOUS | Status: AC
  Administered 2016-06-22: 15:00:00 via INTRAVENOUS

## 2016-06-22 MED ORDER — PROCHLORPERAZINE MALEATE 10 MG PO TABS: 10.0000 mg | ORAL_TABLET | Freq: Four times a day (QID) | ORAL | 1 refills | Status: AC | PRN

## 2016-06-22 NOTE — Telephone Encounter (Signed)
Infusion nurse amy elkins rn calling. Patient states zofran not helping with nausea. Requesting compazine 10 mg. NKDA. Script e-scribed to patient's pharmacy with 1 refill.

## 2016-06-22 NOTE — Patient Instructions (Signed)
Dehydration, Adult Dehydration is a condition in which there is not enough fluid or water in the body. This happens when you lose more fluids than you take in. Important organs, such as the kidneys, brain, and heart, cannot function without a proper amount of fluids. Any loss of fluids from the body can lead to dehydration. Dehydration can range from mild to severe. This condition should be treated right away to prevent it from becoming severe. What are the causes? This condition may be caused by:  Vomiting.  Diarrhea.  Excessive sweating, such as from heat exposure or exercise.  Not drinking enough fluid, especially:  When ill.  While doing activity that requires a lot of energy.  Excessive urination.  Fever.  Infection.  Certain medicines, such as medicines that cause the body to lose excess fluid (diuretics).  Inability to access safe drinking water.  Reduced physical ability to get adequate water and food. What increases the risk? This condition is more likely to develop in people:  Who have a poorly controlled long-term (chronic) illness, such as diabetes, heart disease, or kidney disease.  Who are age 65 or older.  Who are disabled.  Who live in a place with high altitude.  Who play endurance sports. What are the signs or symptoms? Symptoms of mild dehydration may include:   Thirst.  Dry lips.  Slightly dry mouth.  Dry, warm skin.  Dizziness. Symptoms of moderate dehydration may include:   Very dry mouth.  Muscle cramps.  Dark urine. Urine may be the color of tea.  Decreased urine production.  Decreased tear production.  Heartbeat that is irregular or faster than normal (palpitations).  Headache.  Light-headedness, especially when you stand up from a sitting position.  Fainting (syncope). Symptoms of severe dehydration may include:   Changes in skin, such as:  Cold and clammy skin.  Blotchy (mottled) or pale skin.  Skin that does  not quickly return to normal after being lightly pinched and released (poor skin turgor).  Changes in body fluids, such as:  Extreme thirst.  No tear production.  Inability to sweat when body temperature is high, such as in hot weather.  Very little urine production.  Changes in vital signs, such as:  Weak pulse.  Pulse that is more than 100 beats a minute when sitting still.  Rapid breathing.  Low blood pressure.  Other changes, such as:  Sunken eyes.  Cold hands and feet.  Confusion.  Lack of energy (lethargy).  Difficulty waking up from sleep.  Short-term weight loss.  Unconsciousness. How is this diagnosed? This condition is diagnosed based on your symptoms and a physical exam. Blood and urine tests may be done to help confirm the diagnosis. How is this treated? Treatment for this condition depends on the severity. Mild or moderate dehydration can often be treated at home. Treatment should be started right away. Do not wait until dehydration becomes severe. Severe dehydration is an emergency and it needs to be treated in a hospital. Treatment for mild dehydration may include:   Drinking more fluids.  Replacing salts and minerals in your blood (electrolytes) that you may have lost. Treatment for moderate dehydration may include:   Drinking an oral rehydration solution (ORS). This is a drink that helps you replace fluids and electrolytes (rehydrate). It can be found at pharmacies and retail stores. Treatment for severe dehydration may include:   Receiving fluids through an IV tube.  Receiving an electrolyte solution through a feeding tube that is   passed through your nose and into your stomach (nasogastric tube, or NG tube).  Correcting any abnormalities in electrolytes.  Treating the underlying cause of dehydration. Follow these instructions at home:  If directed by your health care provider, drink an ORS:  Make an ORS by following instructions on the  package.  Start by drinking small amounts, about  cup (120 mL) every 5-10 minutes.  Slowly increase how much you drink until you have taken the amount recommended by your health care provider.  Drink enough clear fluid to keep your urine clear or pale yellow. If you were told to drink an ORS, finish the ORS first, then start slowly drinking other clear fluids. Drink fluids such as:  Water. Do not drink only water. Doing that can lead to having too little salt (sodium) in the body (hyponatremia).  Ice chips.  Fruit juice that you have added water to (diluted fruit juice).  Low-calorie sports drinks.  Avoid:  Alcohol.  Drinks that contain a lot of sugar. These include high-calorie sports drinks, fruit juice that is not diluted, and soda.  Caffeine.  Foods that are greasy or contain a lot of fat or sugar.  Take over-the-counter and prescription medicines only as told by your health care provider.  Do not take sodium tablets. This can lead to having too much sodium in the body (hypernatremia).  Eat foods that contain a healthy balance of electrolytes, such as bananas, oranges, potatoes, tomatoes, and spinach.  Keep all follow-up visits as told by your health care provider. This is important. Contact a health care provider if:  You have abdominal pain that:  Gets worse.  Stays in one area (localizes).  You have a rash.  You have a stiff neck.  You are more irritable than usual.  You are sleepier or more difficult to wake up than usual.  You feel weak or dizzy.  You feel very thirsty.  You have urinated only a small amount of very dark urine over 6-8 hours. Get help right away if:  You have symptoms of severe dehydration.  You cannot drink fluids without vomiting.  Your symptoms get worse with treatment.  You have a fever.  You have a severe headache.  You have vomiting or diarrhea that:  Gets worse.  Does not go away.  You have blood or green matter  (bile) in your vomit.  You have blood in your stool. This may cause stool to look black and tarry.  You have not urinated in 6-8 hours.  You faint.  Your heart rate while sitting still is over 100 beats a minute.  You have trouble breathing. This information is not intended to replace advice given to you by your health care provider. Make sure you discuss any questions you have with your health care provider. Document Released: 05/04/2005 Document Revised: 11/29/2015 Document Reviewed: 06/28/2015 Elsevier Interactive Patient Education  2017 [employer]  

## 2016-06-24 ENCOUNTER — Encounter (HOSPITAL_COMMUNITY): Payer: Self-pay

## 2016-06-24 ENCOUNTER — Ambulatory Visit (HOSPITAL_BASED_OUTPATIENT_CLINIC_OR_DEPARTMENT_OTHER): Payer: Medicare Other

## 2016-06-24 ENCOUNTER — Other Ambulatory Visit: Payer: Self-pay | Admitting: *Deleted

## 2016-06-24 VITALS — BP 123/80 | HR 99 | Temp 98.0°F | Resp 16

## 2016-06-24 DIAGNOSIS — R112 Nausea with vomiting, unspecified: Secondary | ICD-10-CM

## 2016-06-24 DIAGNOSIS — C7800 Secondary malignant neoplasm of unspecified lung: Secondary | ICD-10-CM

## 2016-06-24 DIAGNOSIS — C787 Secondary malignant neoplasm of liver and intrahepatic bile duct: Secondary | ICD-10-CM

## 2016-06-24 DIAGNOSIS — C439 Malignant melanoma of skin, unspecified: Secondary | ICD-10-CM

## 2016-06-24 DIAGNOSIS — C7889 Secondary malignant neoplasm of other digestive organs: Secondary | ICD-10-CM

## 2016-06-24 DIAGNOSIS — C430 Malignant melanoma of lip: Secondary | ICD-10-CM

## 2016-06-24 MED ORDER — SODIUM CHLORIDE 0.9 % IV SOLN
Freq: Once | INTRAVENOUS | Status: AC
  Administered 2016-06-24: 13:00:00 via INTRAVENOUS

## 2016-06-24 MED ORDER — SODIUM CHLORIDE 0.9 % IV SOLN
Freq: Once | INTRAVENOUS | Status: DC
  Filled 2016-06-24: qty 4

## 2016-06-24 MED ORDER — PROMETHAZINE HCL 25 MG/ML IJ SOLN
12.5000 mg | Freq: Once | INTRAMUSCULAR | Status: AC
  Administered 2016-06-24: 12.5 mg via INTRAVENOUS
  Filled 2016-06-24: qty 1

## 2016-06-24 MED ORDER — PROMETHAZINE HCL 25 MG PO TABS: 25.0000 mg | ORAL_TABLET | Freq: Four times a day (QID) | ORAL | 0 refills | Status: DC | PRN

## 2016-06-24 NOTE — Progress Notes (Signed)
Nutrition Assessment   Reason for Assessment:   Patient identified on Malnutrition Screening Report for weight loss and poor appetite  ASSESSMENT:  76 year old female with metastatic melanoma. Past medical history right lung cancer.   Patient seen in infusion this pm.  Patient reports nausea and vomiting, medication is not working.  Planning to give phenergan to patient today during infusion.  Patient reports poor po intake for the past 6 weeks.  Ate 1/4 of gravy biscuit yesterday and kept it down. Also reports eats a little bit of ice cream.  Overall only able to eat few bites secondary to nausea and at times vomiting. Has tried ensure/boost drinks.  Reports burning sensation in stomach.  Planning to see GI doctor tomorrow. Unable to get picture of typical intake at this time.      Nutrition Focused Physical Exam: deferred at this time  Medications: remeron, prednisone, zofran, compazine, Vit D  Labs: reviewed  Anthropometrics:   Height: 63 inches Weight: 103 pounds BMI: 18  Patient reports weight loss of 25 pounds in the last 7 months.  20% weight loss in the last 7 months, which is significant    Estimated Energy Needs  Kcals: 1400-1600 calories/d Protein: 56-71 g/d Fluid: 1.6 L/d  NUTRITION DIAGNOSIS: Unintentional weight loss related to altered GI function, cancer related treatment as evidenced by nausea, poor po intake and 20% weight loss in the last 7 months.   MALNUTRITION DIAGNOSIS: Patient meets criteria for severe malnutrition in context of chronic illness as evidenced by 20% weight loss in the last 7 months and eating < or equal to 75% of energy needs for > or equal to 1 month   INTERVENTION:   Encouraged small frequent meals of high calorie, high protein foods. Discussed foods to eat during times of nausea MD aware medications for nausea are not working and adjustments are being made.   Discussed high calorie, high protein foods.  Handout given.    MONITORING, EVALUATION, GOAL: Patient will consume adequate calories and protein to prevent further weight loss   NEXT VISIT: as needed  Bruk Tumolo B. Zenia Resides, [place], [place] (pager)

## 2016-06-24 NOTE — Patient Instructions (Signed)
Dehydration, Adult Dehydration is a condition in which there is not enough fluid or water in the body. This happens when you lose more fluids than you take in. Important organs, such as the kidneys, brain, and heart, cannot function without a proper amount of fluids. Any loss of fluids from the body can lead to dehydration. Dehydration can range from mild to severe. This condition should be treated right away to prevent it from becoming severe. What are the causes? This condition may be caused by:  Vomiting.  Diarrhea.  Excessive sweating, such as from heat exposure or exercise.  Not drinking enough fluid, especially:  When ill.  While doing activity that requires a lot of energy.  Excessive urination.  Fever.  Infection.  Certain medicines, such as medicines that cause the body to lose excess fluid (diuretics).  Inability to access safe drinking water.  Reduced physical ability to get adequate water and food. What increases the risk? This condition is more likely to develop in people:  Who have a poorly controlled long-term (chronic) illness, such as diabetes, heart disease, or kidney disease.  Who are age 65 or older.  Who are disabled.  Who live in a place with high altitude.  Who play endurance sports. What are the signs or symptoms? Symptoms of mild dehydration may include:   Thirst.  Dry lips.  Slightly dry mouth.  Dry, warm skin.  Dizziness. Symptoms of moderate dehydration may include:   Very dry mouth.  Muscle cramps.  Dark urine. Urine may be the color of tea.  Decreased urine production.  Decreased tear production.  Heartbeat that is irregular or faster than normal (palpitations).  Headache.  Light-headedness, especially when you stand up from a sitting position.  Fainting (syncope). Symptoms of severe dehydration may include:   Changes in skin, such as:  Cold and clammy skin.  Blotchy (mottled) or pale skin.  Skin that does  not quickly return to normal after being lightly pinched and released (poor skin turgor).  Changes in body fluids, such as:  Extreme thirst.  No tear production.  Inability to sweat when body temperature is high, such as in hot weather.  Very little urine production.  Changes in vital signs, such as:  Weak pulse.  Pulse that is more than 100 beats a minute when sitting still.  Rapid breathing.  Low blood pressure.  Other changes, such as:  Sunken eyes.  Cold hands and feet.  Confusion.  Lack of energy (lethargy).  Difficulty waking up from sleep.  Short-term weight loss.  Unconsciousness. How is this diagnosed? This condition is diagnosed based on your symptoms and a physical exam. Blood and urine tests may be done to help confirm the diagnosis. How is this treated? Treatment for this condition depends on the severity. Mild or moderate dehydration can often be treated at home. Treatment should be started right away. Do not wait until dehydration becomes severe. Severe dehydration is an emergency and it needs to be treated in a hospital. Treatment for mild dehydration may include:   Drinking more fluids.  Replacing salts and minerals in your blood (electrolytes) that you may have lost. Treatment for moderate dehydration may include:   Drinking an oral rehydration solution (ORS). This is a drink that helps you replace fluids and electrolytes (rehydrate). It can be found at pharmacies and retail stores. Treatment for severe dehydration may include:   Receiving fluids through an IV tube.  Receiving an electrolyte solution through a feeding tube that is   passed through your nose and into your stomach (nasogastric tube, or NG tube).  Correcting any abnormalities in electrolytes.  Treating the underlying cause of dehydration. Follow these instructions at home:  If directed by your health care provider, drink an ORS:  Make an ORS by following instructions on the  package.  Start by drinking small amounts, about  cup (120 mL) every 5-10 minutes.  Slowly increase how much you drink until you have taken the amount recommended by your health care provider.  Drink enough clear fluid to keep your urine clear or pale yellow. If you were told to drink an ORS, finish the ORS first, then start slowly drinking other clear fluids. Drink fluids such as:  Water. Do not drink only water. Doing that can lead to having too little salt (sodium) in the body (hyponatremia).  Ice chips.  Fruit juice that you have added water to (diluted fruit juice).  Low-calorie sports drinks.  Avoid:  Alcohol.  Drinks that contain a lot of sugar. These include high-calorie sports drinks, fruit juice that is not diluted, and soda.  Caffeine.  Foods that are greasy or contain a lot of fat or sugar.  Take over-the-counter and prescription medicines only as told by your health care provider.  Do not take sodium tablets. This can lead to having too much sodium in the body (hypernatremia).  Eat foods that contain a healthy balance of electrolytes, such as bananas, oranges, potatoes, tomatoes, and spinach.  Keep all follow-up visits as told by your health care provider. This is important. Contact a health care provider if:  You have abdominal pain that:  Gets worse.  Stays in one area (localizes).  You have a rash.  You have a stiff neck.  You are more irritable than usual.  You are sleepier or more difficult to wake up than usual.  You feel weak or dizzy.  You feel very thirsty.  You have urinated only a small amount of very dark urine over 6-8 hours. Get help right away if:  You have symptoms of severe dehydration.  You cannot drink fluids without vomiting.  Your symptoms get worse with treatment.  You have a fever.  You have a severe headache.  You have vomiting or diarrhea that:  Gets worse.  Does not go away.  You have blood or green matter  (bile) in your vomit.  You have blood in your stool. This may cause stool to look black and tarry.  You have not urinated in 6-8 hours.  You faint.  Your heart rate while sitting still is over 100 beats a minute.  You have trouble breathing. This information is not intended to replace advice given to you by your health care provider. Make sure you discuss any questions you have with your health care provider. Document Released: 05/04/2005 Document Revised: 11/29/2015 Document Reviewed: 06/28/2015 Elsevier Interactive Patient Education  2017 [employer]  

## 2016-06-25 ENCOUNTER — Ambulatory Visit (INDEPENDENT_AMBULATORY_CARE_PROVIDER_SITE_OTHER): Payer: Medicare Other | Admitting: Gastroenterology

## 2016-06-25 ENCOUNTER — Encounter: Payer: Self-pay | Admitting: Gastroenterology

## 2016-06-25 VITALS — BP 106/72 | HR 84 | Ht 63.0 in | Wt 104.0 lb

## 2016-06-25 DIAGNOSIS — E46 Unspecified protein-calorie malnutrition: Secondary | ICD-10-CM | POA: Diagnosis not present

## 2016-06-25 DIAGNOSIS — R110 Nausea: Secondary | ICD-10-CM | POA: Diagnosis not present

## 2016-06-25 DIAGNOSIS — R634 Abnormal weight loss: Secondary | ICD-10-CM | POA: Diagnosis not present

## 2016-06-25 DIAGNOSIS — C439 Malignant melanoma of skin, unspecified: Secondary | ICD-10-CM

## 2016-06-25 DIAGNOSIS — C799 Secondary malignant neoplasm of unspecified site: Secondary | ICD-10-CM | POA: Diagnosis not present

## 2016-06-25 MED ORDER — PANTOPRAZOLE SODIUM 40 MG PO TBEC: 40.0000 mg | DELAYED_RELEASE_TABLET | Freq: Two times a day (BID) | ORAL | 2 refills | Status: DC

## 2016-06-25 NOTE — Patient Instructions (Addendum)
You have been scheduled for an endoscopy. Please follow written instructions given to you at your visit today. If you use inhalers (even only as needed), please bring them with you on the day of your procedure. Your physician has requested that you go to www.startemmi.com and enter the access code given to you at your visit today. This web site gives a general overview about your procedure. However, you should still follow specific instructions given to you by our office regarding your preparation for the procedure.  We have sent the following medications to your pharmacy for you to pick up at your convenience: Pantoprazole 40 mg twice a day

## 2016-06-25 NOTE — Progress Notes (Addendum)
06/25/2016 Victoria Castro [id number] 30-Jan-1941   HISTORY OF PRESENT ILLNESS:  This is a 76 year old female with metastatic melanoma to her liver, lungs, breast, multiple lymph nodes.  Has intractable nausea.  No improvement with phenergan, zofran, compazine.  Unable to eat/drink much and so no appetite so is receiving IVF's every other day at the cancer center.  Has resulted in signifcant weight loss (16 pounds since October) and malnutrition.  Referred here by Dr. Alen Blew to consider EGD to rule out gastric pathology.  Denies vomiting.  No abdominal pain.  Maybe mild reflux but nothing significant.  Past Medical History:  Diagnosis Date  . HTN (hypertension)   . Melanoma ([place])   . Protein calorie malnutrition ([place])    Past Surgical History:  Procedure Laterality Date  . BREAST LUMPECTOMY     right  . MELANOMA EXCISION    . THORACOTOMY    . TONSILLECTOMY      reports that she quit smoking about 43 years ago. Her smoking use included Cigarettes. She started smoking about 59 years ago. She has a 9.00 pack-year smoking history. She has never used smokeless tobacco. Her alcohol and drug histories are not on file. family history includes Heart disease in her father. No Known Allergies    Outpatient Encounter Prescriptions as of 06/25/2016  Medication Sig  . amLODipine-benazepril (LOTREL) 5-20 MG per capsule Take 1 capsule by mouth daily.  . Cholecalciferol (VITAMIN D) 2000 UNITS tablet Take 2,000 Units by mouth daily.  . mirtazapine (REMERON) 15 MG tablet Take 1 tablet (15 mg total) by mouth at bedtime.  . ondansetron (ZOFRAN ODT) 8 MG disintegrating tablet Take 1 tablet (8 mg total) by mouth every 8 (eight) hours as needed for nausea or vomiting.  . predniSONE (DELTASONE) 10 MG tablet Take as directed in ipilimumab Curt Bears) instructions.  . prochlorperazine (COMPAZINE) 10 MG tablet Take 1 tablet (10 mg total) by mouth every 6 (six) hours as needed for nausea or vomiting.  Marland Kitchen  levothyroxine (SYNTHROID, LEVOTHROID) 25 MCG tablet Take 25 mcg by mouth daily before breakfast.  . pantoprazole (PROTONIX) 40 MG tablet Take 1 tablet (40 mg total) by mouth 2 (two) times daily.  . [DISCONTINUED] megestrol (MEGACE) 400 MG/10ML suspension Take 10 mLs (400 mg total) by mouth 2 (two) times daily. (Patient not taking: Reported on 06/25/2016)  . [DISCONTINUED] promethazine (PHENERGAN) 25 MG tablet Take 1 tablet (25 mg total) by mouth every 6 (six) hours as needed for nausea or vomiting. (Patient not taking: Reported on 06/25/2016)   No facility-administered encounter medications on file as of 06/25/2016.      REVIEW OF SYSTEMS  : All other systems reviewed and negative except where noted in the History of Present Illness.   PHYSICAL EXAM: BP 106/72   Pulse 84   Ht '5\' 3"'$  (1.6 m)   Wt 104 lb (47.2 kg)   BMI 18.42 kg/m  General:  Thin and frail white female in NAD. Head: Normocephalic and atraumatic Eyes:  Sclerae anicteric, conjunctiva pink. Ears: Normal auditory acuity Lungs: Clear throughout to auscultation Heart: Regular rate and rhythm Abdomen: Soft, non-distended. Normal bowel sounds.  Non-tender. Musculoskeletal: Symmetrical with no gross deformities  Skin: No lesions on visible extremities Extremities: No edema  Neurological: Alert oriented x 4, grossly non-focal Psychological:  Alert and cooperative. Normal mood and affect  ASSESSMENT AND PLAN: -76 year old female with metastatic melanoma to her liver, lungs, breast, multiple lymph nodes.  Has intractable  nausea/dyspepsia.  No improvement with phenergan, zofran, compazine.  Unable to eat/drink much so is receiving IVF's every other day at the cancer center.  Has resulted in signifcant weight loss/malnutrition.  Referred here to consider EGD to rule out gastric pathology.  We will schedule for this procedure, although I believe that the yield of finding something significant that is contributing to her nausea is very low.   PET scan did not indicate anything gastric.  I will also place her on pantoprazole 40 mg twice a day to see if this helps, which we would expect that it would if this were related to reflux, gastritis, ulcer.  *The risks, benefits, and alternatives to EGD were discussed with the patient and her daughter and they consent to proceed.   CC:  Wyatt Portela, MD  Thank you for sending this case to me and discussing it in clinic today. I have reviewed the entire note, and the outlined plan seems appropriate.   Wilfrid Lund, MD

## 2016-06-26 ENCOUNTER — Ambulatory Visit (AMBULATORY_SURGERY_CENTER): Payer: Medicare Other | Admitting: Gastroenterology

## 2016-06-26 ENCOUNTER — Telehealth: Payer: Self-pay | Admitting: *Deleted

## 2016-06-26 ENCOUNTER — Ambulatory Visit: Payer: Medicare Other

## 2016-06-26 ENCOUNTER — Encounter: Payer: Self-pay | Admitting: Gastroenterology

## 2016-06-26 VITALS — BP 152/85 | HR 98 | Temp 96.0°F | Resp 19 | Ht 63.0 in | Wt 104.0 lb

## 2016-06-26 DIAGNOSIS — R112 Nausea with vomiting, unspecified: Secondary | ICD-10-CM | POA: Diagnosis present

## 2016-06-26 MED ORDER — SODIUM CHLORIDE 0.9 % IV SOLN: 500.0000 mL | INTRAVENOUS | Status: DC

## 2016-06-26 NOTE — Progress Notes (Signed)
Patient and her daughter arrive to admitting for upper endoscopy. Patient states that she has had acute vision loss over the last 2 days and slurred speech. She states she has not been able to eat in weeks and is weak. Daughter is present and voices concerns as well, but the other daughter the patient lives with, who is a Equities trader, is on her way as well. Dr. Loletha Carrow notified of all of the above and I proceeded with admission screening (with the exception of starting an IV) while I waited for Dr. Loletha Carrow to speak with patient and her daughters. Patient's other daughter arrives and states that patient has had a left neck dissection which is the cause of the facial droop. She also states she believes the slurred speech is due to phenergan the patient had 2 days ago. Daughter states the vision loss is not new. Patient clarifies that the vision loss has dramatically increased over the last 2 days. Dr. Loletha Carrow is here and has spoken with patient and her 2 daughters and instructs to proceed with procedure and run NS IV wide open once IV is started. This has been done. Patient VSS and resting comfortably awaiting procedure. Jani Gravel, RN

## 2016-06-26 NOTE — Op Note (Signed)
[place] Patient Name: Victoria Castro Procedure Date: 06/26/2016 11:32 AM MRN: [id number] Endoscopist: Mallie Mussel L. Loletha Carrow , MD Age: 76 Referring MD:  Date of Birth: Dec 23, 1940 Gender: Female Account #: [id number] Procedure:                Upper GI endoscopy Indications:              Nausea with vomiting Medicines:                Monitored Anesthesia Care Procedure:                Pre-Anesthesia Assessment:                           - Prior to the procedure, a History and Physical                            was performed, and patient medications and                            allergies were reviewed. The patient's tolerance of                            previous anesthesia was also reviewed. The risks                            and benefits of the procedure and the sedation                            options and risks were discussed with the patient.                            All questions were answered, and informed consent                            was obtained. Prior Anticoagulants: The patient has                            taken no previous anticoagulant or antiplatelet                            agents. ASA Grade Assessment: III - A patient with                            severe systemic disease. After reviewing the risks                            and benefits, the patient was deemed in                            satisfactory condition to undergo the procedure.                           After obtaining informed consent, the endoscope was  passed under direct vision. Throughout the                            procedure, the patient's blood pressure, pulse, and                            oxygen saturations were monitored continuously. The                            Model GIF-HQ190 [id number]) scope was introduced                            through the mouth, and advanced to the second part                            of duodenum. The upper GI  endoscopy was                            accomplished without difficulty. The patient                            tolerated the procedure well. Scope In: Scope Out: Findings:                 The esophagus was normal.                           Diffuse atrophic mucosa was found in the entire                            examined stomach.                           The cardia and gastric fundus were normal on                            retroflexion.                           The examined duodenum was normal. Complications:            No immediate complications. Estimated Blood Loss:     Estimated blood loss: none. Impression:               - Normal esophagus.                           - Gastric mucosal atrophy. Normal finding at this                            age.                           - Normal examined duodenum.                           - No specimens collected.  There is no obstructive or inflammatory cause for                            symptoms on this exam. Symptoms appear to be from                            systemic illness of metastatic melanoma. Recommendation:           - Patient has a contact number available for                            emergencies. The signs and symptoms of potential                            delayed complications were discussed with the                            patient. Return to normal activities tomorrow.                            Written discharge instructions were provided to the                            patient.                           - Resume previous diet.                           - Continue present medications. Henry L. Loletha Carrow, MD 06/26/2016 11:51:55 AM This report has been signed electronically.

## 2016-06-26 NOTE — Telephone Encounter (Signed)
Call from pt's daughter reporting she received IV fluids after endoscopy procedure today. Cancel infusion for today. She will come in as scheduled on 2/12. Appt canceled, charge RN notified.

## 2016-06-26 NOTE — Progress Notes (Signed)
To PACU, vss patent aw report to rn 

## 2016-06-26 NOTE — Patient Instructions (Signed)
YOU HAD AN ENDOSCOPIC PROCEDURE TODAY AT THE [facility]:   Refer to the procedure report that was given to you for any specific questions about what was found during the examination.  If the procedure report does not answer your questions, please call your gastroenterologist to clarify.  If you requested that your care partner not be given the details of your procedure findings, then the procedure report has been included in a sealed envelope for you to review at your convenience later.  YOU SHOULD EXPECT: Some feelings of bloating in the abdomen. Passage of more gas than usual.  Walking can help get rid of the air that was put into your GI tract during the procedure and reduce the bloating. If you had a lower endoscopy (such as a colonoscopy or flexible sigmoidoscopy) you may notice spotting of blood in your stool or on the toilet paper. If you underwent a bowel prep for your procedure, you may not have a normal bowel movement for a few days.  Please Note:  You might notice some irritation and congestion in your nose or some drainage.  This is from the oxygen used during your procedure.  There is no need for concern and it should clear up in a day or so.  SYMPTOMS TO REPORT IMMEDIATELY:   Following upper endoscopy (EGD)  Vomiting of blood or coffee ground material  New chest pain or pain under the shoulder blades  Painful or persistently difficult swallowing  New shortness of breath  Fever of 100F or higher  Black, tarry-looking stools  For urgent or emergent issues, a gastroenterologist can be reached at any hour by calling [phone].   DIET:  We do recommend a small meal at first, but then you may proceed to your regular diet.  Drink plenty of fluids but you should avoid alcoholic beverages for 24 hours.  ACTIVITY:  You should plan to take it easy for the rest of today and you should NOT DRIVE or use heavy machinery until tomorrow (because of the sedation medicines used  during the test).    FOLLOW UP: Our staff will call the number listed on your records the next business day following your procedure to check on you and address any questions or concerns that you may have regarding the information given to you following your procedure. If we do not reach you, we will leave a message.  However, if you are feeling well and you are not experiencing any problems, there is no need to return our call.  We will assume that you have returned to your regular daily activities without incident.  If any biopsies were taken you will be contacted by phone or by letter within the next 1-3 weeks.  Please call us at [phone] if you have not heard about the biopsies in 3 weeks.    SIGNATURES/CONFIDENTIALITY: You and/or your care partner have signed paperwork which will be entered into your electronic medical record.  These signatures attest to the fact that that the information above on your After Visit Summary has been reviewed and is understood.  Full responsibility of the confidentiality of this discharge information lies with you and/or your care-partner. 

## 2016-06-29 ENCOUNTER — Telehealth: Payer: Self-pay | Admitting: Oncology

## 2016-06-29 ENCOUNTER — Other Ambulatory Visit (HOSPITAL_BASED_OUTPATIENT_CLINIC_OR_DEPARTMENT_OTHER): Payer: Medicare Other

## 2016-06-29 ENCOUNTER — Telehealth: Payer: Self-pay

## 2016-06-29 ENCOUNTER — Ambulatory Visit (HOSPITAL_BASED_OUTPATIENT_CLINIC_OR_DEPARTMENT_OTHER): Payer: Medicare Other | Admitting: Nurse Practitioner

## 2016-06-29 ENCOUNTER — Ambulatory Visit (HOSPITAL_BASED_OUTPATIENT_CLINIC_OR_DEPARTMENT_OTHER): Payer: Medicare Other | Admitting: Oncology

## 2016-06-29 VITALS — BP 133/78 | HR 87 | Temp 98.0°F | Resp 18 | Ht 63.0 in | Wt 102.8 lb

## 2016-06-29 VITALS — BP 141/76 | HR 93

## 2016-06-29 DIAGNOSIS — C787 Secondary malignant neoplasm of liver and intrahepatic bile duct: Secondary | ICD-10-CM

## 2016-06-29 DIAGNOSIS — C7800 Secondary malignant neoplasm of unspecified lung: Secondary | ICD-10-CM | POA: Diagnosis not present

## 2016-06-29 DIAGNOSIS — E46 Unspecified protein-calorie malnutrition: Secondary | ICD-10-CM | POA: Diagnosis not present

## 2016-06-29 DIAGNOSIS — C770 Secondary and unspecified malignant neoplasm of lymph nodes of head, face and neck: Secondary | ICD-10-CM | POA: Diagnosis not present

## 2016-06-29 DIAGNOSIS — C430 Malignant melanoma of lip: Secondary | ICD-10-CM | POA: Diagnosis not present

## 2016-06-29 DIAGNOSIS — R110 Nausea: Secondary | ICD-10-CM

## 2016-06-29 DIAGNOSIS — E039 Hypothyroidism, unspecified: Secondary | ICD-10-CM

## 2016-06-29 DIAGNOSIS — R634 Abnormal weight loss: Secondary | ICD-10-CM

## 2016-06-29 DIAGNOSIS — R112 Nausea with vomiting, unspecified: Secondary | ICD-10-CM

## 2016-06-29 DIAGNOSIS — C439 Malignant melanoma of skin, unspecified: Secondary | ICD-10-CM

## 2016-06-29 DIAGNOSIS — C799 Secondary malignant neoplasm of unspecified site: Secondary | ICD-10-CM

## 2016-06-29 LAB — CBC WITH DIFFERENTIAL/PLATELET
BASO%: 0.7 % (ref 0.0–2.0)
Basophils Absolute: 0.1 10*3/uL (ref 0.0–0.1)
EOS%: 0.2 % (ref 0.0–7.0)
Eosinophils Absolute: 0 10*3/uL (ref 0.0–0.5)
HEMATOCRIT: 42.9 % (ref 34.8–46.6)
HEMOGLOBIN: 14.1 g/dL (ref 11.6–15.9)
LYMPH#: 1.3 10*3/uL (ref 0.9–3.3)
LYMPH%: 10.2 % — ABNORMAL LOW (ref 14.0–49.7)
MCH: 26.8 pg (ref 25.1–34.0)
MCHC: 32.9 g/dL (ref 31.5–36.0)
MCV: 81.6 fL (ref 79.5–101.0)
MONO#: 1.1 10*3/uL — ABNORMAL HIGH (ref 0.1–0.9)
MONO%: 8.7 % (ref 0.0–14.0)
NEUT#: 9.9 10*3/uL — ABNORMAL HIGH (ref 1.5–6.5)
NEUT%: 80.2 % — ABNORMAL HIGH (ref 38.4–76.8)
Platelets: 201 10*3/uL (ref 145–400)
RBC: 5.26 10*6/uL (ref 3.70–5.45)
RDW: 15.5 % — AB (ref 11.2–14.5)
WBC: 12.3 10*3/uL — AB (ref 3.9–10.3)

## 2016-06-29 MED ORDER — SODIUM CHLORIDE 0.9 % IV SOLN
Freq: Once | INTRAVENOUS | Status: AC
  Administered 2016-06-29: 10:00:00 via INTRAVENOUS

## 2016-06-29 MED ORDER — DEXAMETHASONE 4 MG PO TABS: 4.0000 mg | ORAL_TABLET | Freq: Two times a day (BID) | ORAL | 1 refills | Status: DC

## 2016-06-29 NOTE — Telephone Encounter (Signed)
  Follow up Call-  Call back number 06/26/2016  Post procedure Call Back phone  # [phone]  Permission to leave phone message Yes  Some recent data might be hidden     Patient questions:  Do you have a fever, pain , or abdominal swelling? No. Pain Score  0 *  Have you tolerated food without any problems? Yes.    Have you been able to return to your normal activities? Yes.    Do you have any questions about your discharge instructions: Diet   No. Medications  No. Follow up visit  No.  Do you have questions or concerns about your Care? No.  Actions: * If pain score is 4 or above: No action needed, pain <4.

## 2016-06-29 NOTE — Patient Instructions (Signed)
Dehydration, Adult Dehydration is when there is not enough fluid or water in your body. This happens when you lose more fluids than you take in. Dehydration can range from mild to very bad. It should be treated right away to keep it from getting very bad. Symptoms of mild dehydration may include:   Thirst.  Dry lips.  Slightly dry mouth.  Dry, warm skin.  Dizziness. Symptoms of moderate dehydration may include:   Very dry mouth.  Muscle cramps.  Dark pee (urine). Pee may be the color of tea.  Your body making less pee.  Your eyes making fewer tears.  Heartbeat that is uneven or faster than normal (palpitations).  Headache.  Light-headedness, especially when you stand up from sitting.  Fainting (syncope). Symptoms of very bad dehydration may include:   Changes in skin, such as:  Cold and clammy skin.  Blotchy (mottled) or pale skin.  Skin that does not quickly return to normal after being lightly pinched and let go (poor skin turgor).  Changes in body fluids, such as:  Feeling very thirsty.  Your eyes making fewer tears.  Not sweating when body temperature is high, such as in hot weather.  Your body making very little pee.  Changes in vital signs, such as:  Weak pulse.  Pulse that is more than 100 beats a minute when you are sitting still.  Fast breathing.  Low blood pressure.  Other changes, such as:  Sunken eyes.  Cold hands and feet.  Confusion.  Lack of energy (lethargy).  Trouble waking up from sleep.  Short-term weight loss.  Unconsciousness. Follow these instructions at home:  If told by your doctor, drink an ORS:  Make an ORS by using instructions on the package.  Start by drinking small amounts, about  cup (120 mL) every 5-10 minutes.  Slowly drink more until you have had the amount that your doctor said to have.  Drink enough clear fluid to keep your pee clear or pale yellow. If you were told to drink an ORS, finish the  ORS first, then start slowly drinking clear fluids. Drink fluids such as:  Water. Do not drink only water by itself. Doing that can make the salt (sodium) level in your body get too low (hyponatremia).  Ice chips.  Fruit juice that you have added water to (diluted).  Low-calorie sports drinks.  Avoid:  Alcohol.  Drinks that have a lot of sugar. These include high-calorie sports drinks, fruit juice that does not have water added, and soda.  Caffeine.  Foods that are greasy or have a lot of fat or sugar.  Take over-the-counter and prescription medicines only as told by your doctor.  Do not take salt tablets. Doing that can make the salt level in your body get too high (hypernatremia).  Eat foods that have minerals (electrolytes). Examples include bananas, oranges, potatoes, tomatoes, and spinach.  Keep all follow-up visits as told by your doctor. This is important. Contact a doctor if:  You have belly (abdominal) pain that:  Gets worse.  Stays in one area (localizes).  You have a rash.  You have a stiff neck.  You get angry or annoyed more easily than normal (irritability).  You are more sleepy than normal.  You have a harder time waking up than normal.  You feel:  Weak.  Dizzy.  Very thirsty.  You have peed (urinated) only a small amount of very dark pee during 6-8 hours. Get help right away if:  You   have symptoms of very bad dehydration.  You cannot drink fluids without throwing up (vomiting).  Your symptoms get worse with treatment.  You have a fever.  You have a very bad headache.  You are throwing up or having watery poop (diarrhea) and it:  Gets worse.  Does not go away.  You have blood or something green (bile) in your throw-up.  You have blood in your poop (stool). This may cause poop to look black and tarry.  You have not peed in 6-8 hours.  You pass out (faint).  Your heart rate when you are sitting still is more than 100 beats a  minute.  You have trouble breathing. This information is not intended to replace advice given to you by your health care provider. Make sure you discuss any questions you have with your health care provider. Document Released: 02/28/2009 Document Revised: 11/22/2015 Document Reviewed: 06/28/2015 Elsevier Interactive Patient Education  2017 [employer]  

## 2016-06-29 NOTE — Telephone Encounter (Signed)
Attempted to reach pt. For follow up call following endoscopic procedure on Friday.   Will try to reach pt. Later today.

## 2016-06-29 NOTE — Progress Notes (Signed)
Hematology and Oncology Follow Up Visit  Victoria Castro [id number] 21-May-1940 76 y.o. 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 She developed subsequently cervical adenopathy in June 2017. Her final pathological staging was IIIB ([id number]).  She was found to have metastatic melanoma in January 2018. The BRAF testing is pending.   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 She underwent Mohs procedure in February 2017 which did not show any residual malignancy at that time.  She subsequently started developing a tender mass in her right submandibular region which did not improve with antibiotics. Evaluate her aspiration done on 09/16/2015 showed atypical cells suspicious for malignancy.  PET scan showed right-sided lymphadenopathy at the level of IIa. She subsequently underwent a repeat biopsy on 10/02/2015 which confirmed the presence of metastatic melanoma. She is status post neck dissection performed on 10/22/2015 which showed 2 out of 7 lymph nodes involved with metastatic melanoma. Adjuvant ipilimumab at reduced dose of 3 mg/kg every 3 weeks for a total of 4 doses. She is supposed 4 treatments completed in 03/17/2016. She is status post liver biopsy on 06/11/2016. The biopsy confirmed the presence of metastatic melanoma with BRAF testing still pending.  Current therapy: Supportive management.  Interim History: Victoria Castro presents today for a follow-up visit. Since the last visit, she underwent an upper endoscopy and did not show any clear cut abnormalities at this time. She continues to have issues with poor appetite and poor by mouth intake. She does have nausea but no vomiting. She denied any abdominal pain. She does report blurry vision but no headaches. She denied any dizziness or  syncope. She denied any seizure activities. She is trying to eat small frequent meals and have had a reasonable job of maintaining her weight despite her poor by mouth intake. She did require IV hydration periodically last week.   She does not report any fevers or chills or sweats. She does not report any wheezing or hemoptysis. She does not report any chest pain, palpitation, orthopnea or leg edema. She does not report any frequency urgency or hesitancy. She does not report any skeletal complaints. Remaining review of systems unremarkable.   Medications: I have reviewed the patient's current medications.  Current Outpatient Prescriptions  Medication Sig Dispense Refill  . amLODipine-benazepril (LOTREL) 5-20 MG per capsule Take 1 capsule by mouth daily.    . Cholecalciferol (VITAMIN D) 2000 UNITS tablet Take 2,000 Units by mouth daily.    Marland Kitchen dexamethasone (DECADRON) 4 MG tablet Take 1 tablet (4 mg total) by mouth 2 (two) times daily. 60 tablet 1  . levothyroxine (SYNTHROID, LEVOTHROID) 25 MCG tablet Take 25 mcg by mouth daily before breakfast.    . mirtazapine (REMERON) 15 MG tablet Take 1 tablet (15 mg total) by mouth at bedtime. (Patient not taking: Reported on 06/26/2016) 30 tablet 1  . ondansetron (ZOFRAN ODT) 8 MG disintegrating tablet Take 1 tablet (8 mg total) by mouth every 8 (eight) hours as needed for nausea or vomiting. 30 tablet 1  . pantoprazole (PROTONIX) 40 MG tablet Take 1 tablet (40 mg total) by mouth 2 (two) times daily. 60 tablet 2  . prochlorperazine (COMPAZINE) 10 MG tablet Take 1 tablet (10 mg total) by mouth every 6 (six) hours as needed for nausea or vomiting. 30 tablet 1  .  promethazine (PHENERGAN) 25 MG tablet Take 25 mg by mouth every 6 (six) hours as needed for nausea or vomiting.     Current Facility-Administered Medications  Medication Dose Route Frequency Provider Last Rate Last Dose  . 0.9 %  sodium chloride infusion  500 mL Intravenous Continuous Nelida Meuse III, MD          Allergies: No Known Allergies  Past Medical History, Surgical history, Social history, and Family History were reviewed and updated.   Physical Exam: Blood pressure 133/78, pulse 87, temperature 98 F (36.7 C), temperature source Oral, resp. rate 18, height 5' 3"  (1.6 m), weight 102 lb 12.8 oz (46.6 kg), SpO2 99 %. ECOG: 1 General appearance:  Frail elderly woman appeared without distress. Head: Normocephalic, without obvious abnormality no oral thrush or ulcers. Neck: no adenopathy Lymph nodes: Cervical, supraclavicular, and axillary nodes normal. Heart:regular rate and rhythm, S1, S2 normal, no murmur, click, rub or gallop Lung:chest clear, no wheezing, rales, normal symmetric air entry.  Abdomin: soft, non-tender, without masses or organomegaly no rebound or guarding. EXT:no erythema, induration, or nodules   Lab Results: Lab Results  Component Value Date   WBC 12.3 (H) 06/29/2016   HGB 14.1 06/29/2016   HCT 42.9 06/29/2016   MCV 81.6 06/29/2016   PLT 201 06/29/2016     Chemistry      Component Value Date/Time   NA 139 06/11/2016 1119   NA 138 05/28/2016 1040   K 3.9 06/11/2016 1119   K 3.7 05/28/2016 1040   CL 100 (L) 06/11/2016 1119   CL 100 02/21/2014 0902   CO2 28 06/11/2016 1119   CO2 21 (L) 05/28/2016 1040   BUN 24 (H) 06/11/2016 1119   BUN 19.3 05/28/2016 1040   CREATININE 0.79 06/11/2016 1119   CREATININE 0.7 05/28/2016 1040      Component Value Date/Time   CALCIUM 9.9 06/11/2016 1119   CALCIUM 10.9 (H) 05/28/2016 1040   ALKPHOS 110 06/11/2016 1119   ALKPHOS 129 05/28/2016 1040   AST 18 06/11/2016 1119   AST 10 05/28/2016 1040   ALT 16 06/11/2016 1119   ALT 9 05/28/2016 1040   BILITOT 0.6 06/11/2016 1119   BILITOT 0.94 05/28/2016 1040      Impression and Plan:  76 year old woman with the following issues:  1. 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 She was initially found to have a lesion on the right upper  lip and subsequently developed cervical adenopathy with the final pathological staging as IIIB (TisN2aM0). She is status post Mohs procedure followed by neck dissection completed in June 2017.  She is S/P adjuvant therapy utilizing ipilimumab at 3 mg/kg every 3 weeks. She is status post 4 treatments completed without acute toxicities. She did develop delayed toxicity including hypothyroidism which she is currently being treated for.  PET CT scan obtained on 05/28/2016 showed evidence of metastatic disease in the liver as well as in the lung.   The biopsy from her liver mass confirmed the presence of melanoma but her BRAF testing is currently pending.   Definitive therapy for her melanoma will be deferred at this time until she is feeling better. This will also be dictated by her BRAF nutritional status. The results should be available today.   2. Nausea and vomiting with poor by mouth intake and malnutrition: She status post endoscopy completed on 06/26/2016 without any abnormalities.  The etiology of her nausea and vomiting could be central and evaluation for metastatic melanoma  in the brain would be next. In the meantime, I recommended proceeding with dexamethasone 4 mg twice a day to help her with nausea, poor by mouth intake and will also treat any edema surrounding her CNS metastasis if is documented.  If her brain MRI does show brain metastasis, radiation therapy will be next.  3. Hypercalcemia: Related to malignancy. She is status post Zometa on January 11 and her calcium corrected on January 25. This will be repeated today.  4. Prognosis: She understands that she is an incurable malignancy with poor prognosis. She is willing to try any modality to palliate her symptoms at this time which include IV hydration, radiation therapy and possible oral anticancer therapy.  5. Follow-up: Will be 07/06/2016 to follow on her status and discuss treatment options at that time.   [facility],  MD 2/12/20189:24 AM

## 2016-06-29 NOTE — Progress Notes (Signed)
RN visit only. 

## 2016-06-29 NOTE — Telephone Encounter (Signed)
Appointments scheduled per 2/12 LOS. Patient given AVS report and calendars with future scheduled appointments.

## 2016-06-30 ENCOUNTER — Encounter: Payer: Self-pay | Admitting: *Deleted

## 2016-06-30 ENCOUNTER — Encounter (HOSPITAL_COMMUNITY): Payer: Self-pay

## 2016-06-30 ENCOUNTER — Telehealth: Payer: Self-pay | Admitting: *Deleted

## 2016-06-30 NOTE — Telephone Encounter (Signed)
Per 2/12 LOS and staff message I have scheduled appts. I have called and gave the daughter the appas

## 2016-07-01 ENCOUNTER — Ambulatory Visit (HOSPITAL_BASED_OUTPATIENT_CLINIC_OR_DEPARTMENT_OTHER): Payer: Medicare Other | Admitting: Nurse Practitioner

## 2016-07-01 ENCOUNTER — Telehealth: Payer: Self-pay | Admitting: Oncology

## 2016-07-01 VITALS — BP 133/72 | HR 95 | Temp 97.5°F | Resp 17 | Ht 63.0 in | Wt 103.3 lb

## 2016-07-01 DIAGNOSIS — R112 Nausea with vomiting, unspecified: Secondary | ICD-10-CM

## 2016-07-01 DIAGNOSIS — C430 Malignant melanoma of lip: Secondary | ICD-10-CM

## 2016-07-01 DIAGNOSIS — C787 Secondary malignant neoplasm of liver and intrahepatic bile duct: Secondary | ICD-10-CM

## 2016-07-01 DIAGNOSIS — C439 Malignant melanoma of skin, unspecified: Secondary | ICD-10-CM

## 2016-07-01 DIAGNOSIS — C7800 Secondary malignant neoplasm of unspecified lung: Secondary | ICD-10-CM

## 2016-07-01 DIAGNOSIS — C770 Secondary and unspecified malignant neoplasm of lymph nodes of head, face and neck: Secondary | ICD-10-CM

## 2016-07-01 MED ORDER — SODIUM CHLORIDE 0.9 % IV SOLN
Freq: Once | INTRAVENOUS | Status: AC
  Administered 2016-07-01: 12:00:00 via INTRAVENOUS

## 2016-07-01 NOTE — Patient Instructions (Signed)
Dehydration, Adult Dehydration is a condition in which there is not enough fluid or water in the body. This happens when you lose more fluids than you take in. Important organs, such as the kidneys, brain, and heart, cannot function without a proper amount of fluids. Any loss of fluids from the body can lead to dehydration. Dehydration can range from mild to severe. This condition should be treated right away to prevent it from becoming severe. What are the causes? This condition may be caused by:  Vomiting.  Diarrhea.  Excessive sweating, such as from heat exposure or exercise.  Not drinking enough fluid, especially:  When ill.  While doing activity that requires a lot of energy.  Excessive urination.  Fever.  Infection.  Certain medicines, such as medicines that cause the body to lose excess fluid (diuretics).  Inability to access safe drinking water.  Reduced physical ability to get adequate water and food. What increases the risk? This condition is more likely to develop in people:  Who have a poorly controlled long-term (chronic) illness, such as diabetes, heart disease, or kidney disease.  Who are age 65 or older.  Who are disabled.  Who live in a place with high altitude.  Who play endurance sports. What are the signs or symptoms? Symptoms of mild dehydration may include:   Thirst.  Dry lips.  Slightly dry mouth.  Dry, warm skin.  Dizziness. Symptoms of moderate dehydration may include:   Very dry mouth.  Muscle cramps.  Dark urine. Urine may be the color of tea.  Decreased urine production.  Decreased tear production.  Heartbeat that is irregular or faster than normal (palpitations).  Headache.  Light-headedness, especially when you stand up from a sitting position.  Fainting (syncope). Symptoms of severe dehydration may include:   Changes in skin, such as:  Cold and clammy skin.  Blotchy (mottled) or pale skin.  Skin that does  not quickly return to normal after being lightly pinched and released (poor skin turgor).  Changes in body fluids, such as:  Extreme thirst.  No tear production.  Inability to sweat when body temperature is high, such as in hot weather.  Very little urine production.  Changes in vital signs, such as:  Weak pulse.  Pulse that is more than 100 beats a minute when sitting still.  Rapid breathing.  Low blood pressure.  Other changes, such as:  Sunken eyes.  Cold hands and feet.  Confusion.  Lack of energy (lethargy).  Difficulty waking up from sleep.  Short-term weight loss.  Unconsciousness. How is this diagnosed? This condition is diagnosed based on your symptoms and a physical exam. Blood and urine tests may be done to help confirm the diagnosis. How is this treated? Treatment for this condition depends on the severity. Mild or moderate dehydration can often be treated at home. Treatment should be started right away. Do not wait until dehydration becomes severe. Severe dehydration is an emergency and it needs to be treated in a hospital. Treatment for mild dehydration may include:   Drinking more fluids.  Replacing salts and minerals in your blood (electrolytes) that you may have lost. Treatment for moderate dehydration may include:   Drinking an oral rehydration solution (ORS). This is a drink that helps you replace fluids and electrolytes (rehydrate). It can be found at pharmacies and retail stores. Treatment for severe dehydration may include:   Receiving fluids through an IV tube.  Receiving an electrolyte solution through a feeding tube that is   passed through your nose and into your stomach (nasogastric tube, or NG tube).  Correcting any abnormalities in electrolytes.  Treating the underlying cause of dehydration. Follow these instructions at home:  If directed by your health care provider, drink an ORS:  Make an ORS by following instructions on the  package.  Start by drinking small amounts, about  cup (120 mL) every 5-10 minutes.  Slowly increase how much you drink until you have taken the amount recommended by your health care provider.  Drink enough clear fluid to keep your urine clear or pale yellow. If you were told to drink an ORS, finish the ORS first, then start slowly drinking other clear fluids. Drink fluids such as:  Water. Do not drink only water. Doing that can lead to having too little salt (sodium) in the body (hyponatremia).  Ice chips.  Fruit juice that you have added water to (diluted fruit juice).  Low-calorie sports drinks.  Avoid:  Alcohol.  Drinks that contain a lot of sugar. These include high-calorie sports drinks, fruit juice that is not diluted, and soda.  Caffeine.  Foods that are greasy or contain a lot of fat or sugar.  Take over-the-counter and prescription medicines only as told by your health care provider.  Do not take sodium tablets. This can lead to having too much sodium in the body (hypernatremia).  Eat foods that contain a healthy balance of electrolytes, such as bananas, oranges, potatoes, tomatoes, and spinach.  Keep all follow-up visits as told by your health care provider. This is important. Contact a health care provider if:  You have abdominal pain that:  Gets worse.  Stays in one area (localizes).  You have a rash.  You have a stiff neck.  You are more irritable than usual.  You are sleepier or more difficult to wake up than usual.  You feel weak or dizzy.  You feel very thirsty.  You have urinated only a small amount of very dark urine over 6-8 hours. Get help right away if:  You have symptoms of severe dehydration.  You cannot drink fluids without vomiting.  Your symptoms get worse with treatment.  You have a fever.  You have a severe headache.  You have vomiting or diarrhea that:  Gets worse.  Does not go away.  You have blood or green matter  (bile) in your vomit.  You have blood in your stool. This may cause stool to look black and tarry.  You have not urinated in 6-8 hours.  You faint.  Your heart rate while sitting still is over 100 beats a minute.  You have trouble breathing. This information is not intended to replace advice given to you by your health care provider. Make sure you discuss any questions you have with your health care provider. Document Released: 05/04/2005 Document Revised: 11/29/2015 Document Reviewed: 06/28/2015 Elsevier Interactive Patient Education  2017 [employer]  

## 2016-07-01 NOTE — Progress Notes (Signed)
RN visit only for IV fluids. 

## 2016-07-01 NOTE — Telephone Encounter (Signed)
No los per 07/01/16 visit.

## 2016-07-02 ENCOUNTER — Inpatient Hospital Stay (HOSPITAL_COMMUNITY)
Admission: EM | Admit: 2016-07-02 | Discharge: 2016-07-03 | DRG: 064 | Disposition: A | Discharge status: Home Health | Payer: Medicare Other | Attending: Internal Medicine | Admitting: Internal Medicine

## 2016-07-02 ENCOUNTER — Other Ambulatory Visit: Payer: Self-pay

## 2016-07-02 ENCOUNTER — Encounter (HOSPITAL_COMMUNITY): Payer: Self-pay | Admitting: Emergency Medicine

## 2016-07-02 ENCOUNTER — Ambulatory Visit (HOSPITAL_COMMUNITY)
Admission: RE | Admit: 2016-07-02 | Discharge: 2016-07-02 | Disposition: A | Discharge status: Home / Self Care | Payer: Medicare Other | Source: Ambulatory Visit | Attending: Oncology | Admitting: Oncology

## 2016-07-02 ENCOUNTER — Inpatient Hospital Stay (HOSPITAL_COMMUNITY): Payer: Medicare Other

## 2016-07-02 DIAGNOSIS — C799 Secondary malignant neoplasm of unspecified site: Secondary | ICD-10-CM

## 2016-07-02 DIAGNOSIS — E876 Hypokalemia: Secondary | ICD-10-CM | POA: Diagnosis present

## 2016-07-02 DIAGNOSIS — E039 Hypothyroidism, unspecified: Secondary | ICD-10-CM | POA: Diagnosis present

## 2016-07-02 DIAGNOSIS — D72829 Elevated white blood cell count, unspecified: Secondary | ICD-10-CM | POA: Diagnosis present

## 2016-07-02 DIAGNOSIS — R64 Cachexia: Secondary | ICD-10-CM | POA: Diagnosis present

## 2016-07-02 DIAGNOSIS — Z8249 Family history of ischemic heart disease and other diseases of the circulatory system: Secondary | ICD-10-CM

## 2016-07-02 DIAGNOSIS — Z85820 Personal history of malignant melanoma of skin: Secondary | ICD-10-CM

## 2016-07-02 DIAGNOSIS — E43 Unspecified severe protein-calorie malnutrition: Secondary | ICD-10-CM | POA: Diagnosis present

## 2016-07-02 DIAGNOSIS — C50919 Malignant neoplasm of unspecified site of unspecified female breast: Secondary | ICD-10-CM

## 2016-07-02 DIAGNOSIS — C439 Malignant melanoma of skin, unspecified: Secondary | ICD-10-CM

## 2016-07-02 DIAGNOSIS — R402143 Coma scale, eyes open, spontaneous, at hospital admission: Secondary | ICD-10-CM | POA: Diagnosis present

## 2016-07-02 DIAGNOSIS — R634 Abnormal weight loss: Secondary | ICD-10-CM

## 2016-07-02 DIAGNOSIS — R402363 Coma scale, best motor response, obeys commands, at hospital admission: Secondary | ICD-10-CM | POA: Diagnosis present

## 2016-07-02 DIAGNOSIS — R110 Nausea: Secondary | ICD-10-CM | POA: Diagnosis present

## 2016-07-02 DIAGNOSIS — Z79899 Other long term (current) drug therapy: Secondary | ICD-10-CM

## 2016-07-02 DIAGNOSIS — I639 Cerebral infarction, unspecified: Secondary | ICD-10-CM | POA: Diagnosis not present

## 2016-07-02 DIAGNOSIS — Z87891 Personal history of nicotine dependence: Secondary | ICD-10-CM

## 2016-07-02 DIAGNOSIS — M810 Age-related osteoporosis without current pathological fracture: Secondary | ICD-10-CM

## 2016-07-02 DIAGNOSIS — Z681 Body mass index (BMI) 19 or less, adult: Secondary | ICD-10-CM | POA: Diagnosis not present

## 2016-07-02 DIAGNOSIS — T380X5A Adverse effect of glucocorticoids and synthetic analogues, initial encounter: Secondary | ICD-10-CM | POA: Diagnosis present

## 2016-07-02 DIAGNOSIS — R2990 Unspecified symptoms and signs involving the nervous system: Secondary | ICD-10-CM

## 2016-07-02 DIAGNOSIS — I63433 Cerebral infarction due to embolism of bilateral posterior cerebral arteries: Principal | ICD-10-CM | POA: Diagnosis present

## 2016-07-02 DIAGNOSIS — C787 Secondary malignant neoplasm of liver and intrahepatic bile duct: Secondary | ICD-10-CM | POA: Diagnosis present

## 2016-07-02 DIAGNOSIS — R059 Cough, unspecified: Secondary | ICD-10-CM

## 2016-07-02 DIAGNOSIS — R402253 Coma scale, best verbal response, oriented, at hospital admission: Secondary | ICD-10-CM | POA: Diagnosis present

## 2016-07-02 DIAGNOSIS — I10 Essential (primary) hypertension: Secondary | ICD-10-CM | POA: Diagnosis present

## 2016-07-02 DIAGNOSIS — R05 Cough: Secondary | ICD-10-CM

## 2016-07-02 DIAGNOSIS — C3410 Malignant neoplasm of upper lobe, unspecified bronchus or lung: Secondary | ICD-10-CM

## 2016-07-02 DIAGNOSIS — I6340 Cerebral infarction due to embolism of unspecified cerebral artery: Secondary | ICD-10-CM | POA: Diagnosis not present

## 2016-07-02 LAB — COMPREHENSIVE METABOLIC PANEL
ALT: 22 U/L (ref 14–54)
AST: 33 U/L (ref 15–41)
Albumin: 3 g/dL — ABNORMAL LOW (ref 3.5–5.0)
Alkaline Phosphatase: 104 U/L (ref 38–126)
Anion gap: 11 (ref 5–15)
BUN: 17 mg/dL (ref 6–20)
CHLORIDE: 103 mmol/L (ref 101–111)
CO2: 28 mmol/L (ref 22–32)
CREATININE: 0.73 mg/dL (ref 0.44–1.00)
Calcium: 9.2 mg/dL (ref 8.9–10.3)
GFR calc Af Amer: >60 mL/min (ref >60)
Glucose, Bld: 123 mg/dL — ABNORMAL HIGH (ref 65–99)
POTASSIUM: 3.4 mmol/L — AB (ref 3.5–5.1)
SODIUM: 142 mmol/L (ref 135–145)
Total Bilirubin: 0.8 mg/dL (ref 0.3–1.2)
Total Protein: 6.1 g/dL — ABNORMAL LOW (ref 6.5–8.1)

## 2016-07-02 LAB — CBC WITH DIFFERENTIAL/PLATELET
Basophils Absolute: 0 10*3/uL (ref 0.0–0.1)
Basophils Relative: 0 %
EOS ABS: 0 10*3/uL (ref 0.0–0.7)
Eosinophils Relative: 0 %
HEMATOCRIT: 44.6 % (ref 36.0–46.0)
HEMOGLOBIN: 14.6 g/dL (ref 12.0–15.0)
LYMPHS ABS: 1.3 10*3/uL (ref 0.7–4.0)
LYMPHS PCT: 8 %
MCH: 26.5 pg (ref 26.0–34.0)
MCHC: 32.7 g/dL (ref 30.0–36.0)
MCV: 80.9 fL (ref 78.0–100.0)
MONOS PCT: 9 %
Monocytes Absolute: 1.5 10*3/uL — ABNORMAL HIGH (ref 0.1–1.0)
NEUTROS PCT: 83 %
Neutro Abs: 14.2 10*3/uL — ABNORMAL HIGH (ref 1.7–7.7)
Platelets: 135 10*3/uL — ABNORMAL LOW (ref 150–400)
RBC: 5.51 MIL/uL — AB (ref 3.87–5.11)
RDW: 15.7 % — ABNORMAL HIGH (ref 11.5–15.5)
WBC: 17 10*3/uL — AB (ref 4.0–10.5)

## 2016-07-02 LAB — PROTIME-INR
INR: 1.09
Prothrombin Time: 14.2 seconds (ref 11.4–15.2)

## 2016-07-02 LAB — I-STAT TROPONIN, ED: Troponin i, poc: 0.02 ng/mL (ref 0.00–0.08)

## 2016-07-02 MED ORDER — IOPAMIDOL (ISOVUE-370) INJECTION 76%
INTRAVENOUS | Status: AC
  Filled 2016-07-02: qty 100

## 2016-07-02 MED ORDER — GADOBENATE DIMEGLUMINE 529 MG/ML IV SOLN
10.0000 mL | Freq: Once | INTRAVENOUS | Status: AC | PRN
Start: 2016-07-02 — End: 2016-07-02
  Administered 2016-07-02: 9 mL via INTRAVENOUS

## 2016-07-02 MED ORDER — IOPAMIDOL (ISOVUE-370) INJECTION 76%
100.0000 mL | Freq: Once | INTRAVENOUS | Status: AC | PRN
  Administered 2016-07-02: 80 mL via INTRAVENOUS

## 2016-07-02 NOTE — ED Provider Notes (Signed)
[facility] DEPT Provider Note   CSN: [id number] Arrival date & time: 07/02/16  2026     History   Chief Complaint Chief Complaint  Patient presents with  . Cerebrovascular Accident    HPI Victoria Castro is a 76 y.o. female.  HPI   Patient is a 76 year old female with history of metastatic melanoma. She presented to outpatient MRI to evaluate for suspected metastatic melanom to the brain. Patient's had nausea for the last several weeks. MRI shows acute and subacute hemorrhages. Radiology sent patient here to emergency department for evaluation. Patient had no neurologic symptoms. She has had chronic nausea refractory to multiple medications.  Past Medical History:  Diagnosis Date  . HTN (hypertension)   . Melanoma ([place])   . Protein calorie malnutrition [facility])     Patient Active Problem List   Diagnosis Date Noted  . Nausea without vomiting 06/25/2016  . Metastatic melanoma ([place]) 06/25/2016  . Loss of weight 06/25/2016  . Malnutrition, calorie ([place]) 06/25/2016  . Melanoma of skin ([place]) 12/12/2015  . Cancer of right lung ([place]) 02/21/2014    Past Surgical History:  Procedure Laterality Date  . BREAST LUMPECTOMY     right  . MELANOMA EXCISION    . THORACOTOMY    . TONSILLECTOMY      OB History    No data available       Home Medications    Prior to Admission medications   Medication Sig Start Date End Date Taking? Authorizing Provider  amLODipine-benazepril (LOTREL) 5-20 MG per capsule Take 1 capsule by mouth daily.   Yes Historical Provider, MD  dexamethasone (DECADRON) 4 MG tablet Take 1 tablet (4 mg total) by mouth 2 (two) times daily. 06/29/16  Yes Wyatt Portela, MD  levothyroxine (SYNTHROID, LEVOTHROID) 50 MCG tablet Take 50 mcg by mouth daily before breakfast.   Yes Historical Provider, MD  mirtazapine (REMERON) 15 MG tablet Take 1 tablet (15 mg total) by mouth at bedtime. 05/29/16  Yes Wyatt Portela, MD  ondansetron (ZOFRAN ODT) 8 MG disintegrating  tablet Take 1 tablet (8 mg total) by mouth every 8 (eight) hours as needed for nausea or vomiting. 06/11/16  Yes Wyatt Portela, MD  pantoprazole (PROTONIX) 40 MG tablet Take 1 tablet (40 mg total) by mouth 2 (two) times daily. 06/25/16  Yes Jessica D Zehr, PA-C  prochlorperazine (COMPAZINE) 10 MG tablet Take 1 tablet (10 mg total) by mouth every 6 (six) hours as needed for nausea or vomiting. 06/22/16  Yes Wyatt Portela, MD  promethazine (PHENERGAN) 25 MG tablet Take 25 mg by mouth every 6 (six) hours as needed for nausea or vomiting.   Yes Historical Provider, MD    Family History Family History  Problem Relation Age of Onset  . Heart disease Father   . Esophageal cancer Neg Hx   . Colon cancer Neg Hx   . Colon polyps Neg Hx   . Stomach cancer Neg Hx   . Rectal cancer Neg Hx     Social History Social History  Substance Use Topics  . Smoking status: Former Smoker    Packs/day: 0.50    Years: 18.00    Types: Cigarettes    Start date: 09/21/1956    Quit date: 02/21/1973  . Smokeless tobacco: Never Used     Comment: quit 40 years ago  . Alcohol use No     Allergies   Patient has no known allergies.   Review of Systems Review of  Systems  Constitutional: Negative for activity change.  Respiratory: Negative for shortness of breath.   Cardiovascular: Negative for chest pain.  Gastrointestinal: Negative for abdominal pain.  All other systems reviewed and are negative.    Physical Exam Updated Vital Signs BP 144/99 (BP Location: Right Arm)   Pulse 101   Temp 97.6 F (36.4 C) (Oral)   Resp 18   Ht '5\' 3"'$  (1.6 m)   Wt 103 lb (46.7 kg)   SpO2 97%   BMI 18.25 kg/m   Physical Exam  Constitutional: She is oriented to person, place, and time. She appears well-developed and well-nourished.  HENT:  Head: Normocephalic and atraumatic.  Mild left facial droop secondary to nerve injury during during surgery  Eyes: Right eye exhibits no discharge.  Cardiovascular: Normal rate,  regular rhythm and normal heart sounds.   No murmur heard. Pulmonary/Chest: Effort normal and breath sounds normal. She has no wheezes. She has no rales.  Abdominal: Soft. She exhibits no distension. There is no tenderness.  Neurological: She is oriented to person, place, and time.  Equal strength bilaterally upper and lower extremities negative pronator drift. Normal sensation bilaterally. Speech comprehensible, no slurring. Facial nerve tested , L <R l. Alert and oriented 3.   Skin: Skin is warm and dry. She is not diaphoretic.  Psychiatric: She has a normal mood and affect.  Nursing note and vitals reviewed.    ED Treatments / Results  Labs (all labs ordered are listed, but only abnormal results are displayed) Labs Reviewed  COMPREHENSIVE METABOLIC PANEL - Abnormal; Notable for the following:       Result Value   Potassium 3.4 (*)    Glucose, Bld 123 (*)    Total Protein 6.1 (*)    Albumin 3.0 (*)    All other components within normal limits  CBC WITH DIFFERENTIAL/PLATELET - Abnormal; Notable for the following:    WBC 17.0 (*)    RBC 5.51 (*)    RDW 15.7 (*)    Platelets 135 (*)    Neutro Abs 14.2 (*)    Monocytes Absolute 1.5 (*)    All other components within normal limits  PROTIME-INR  Randolm Idol, ED    EKG  EKG Interpretation None       Radiology Mr Jeri Cos Wo Contrast  Result Date: 07/02/2016 CLINICAL DATA:  Melanoma.  Evaluate for metastases.  Nausea. EXAM: MRI HEAD WITHOUT AND WITH CONTRAST TECHNIQUE: Multiplanar, multiecho pulse sequences of the brain and surrounding structures were obtained without and with intravenous contrast. CONTRAST:  16m MULTIHANCE GADOBENATE DIMEGLUMINE 529 MG/ML IV SOLN COMPARISON:  None. FINDINGS: Brain: Patchy restricted diffusion throughout the bilateral occipital lobes, right posterior corpus callosum, left superior cerebellum/upper vermis, and right posterior pons. The most confluent area of infarction is a moderate area  in the left occipital pole measuring up to 35 mm. Patchy superficial enhancement in the occipital lobes attributed to subacute infarct. When accounting for probable ischemic enhancement, no brain metastasis is suspected. No hemorrhage or hydrocephalus. Small remote inferior left cerebellar infarct. Vascular: Preserved major vessel flow voids. Small developmental venous anomaly in the left cerebellum. Skull and upper cervical spine: No marrow lesion noted. Sinuses/Orbits: Negative Other: The patient's daughter is being made aware of the stroke findings by peforming technologist, with [facility] [place]. IMPRESSION: 1. Numerous acute and subacute infarcts in the posterior circulation as  described above. This outpatient is being referred to the ER. 2. Bilateral occipital enhancement is attributed to subacute infarct. No suspected brain metastasis, but suggest follow-up in 2-3 months at which time ischemic enhancement should be resolved. Electronically Signed   By: Monte Fantasia M.D.   On: 07/02/2016 20:24    Procedures Procedures (including critical care time)  Medications Ordered in ED Medications - No data to display   Initial Impression / Assessment and Plan / ED Course  I have reviewed the triage vital signs and the nursing notes.  Pertinent labs & imaging results that were available during my care of the patient were reviewed by me and considered in my medical decision making (see chart for details).     Patient's very pleasant 76 year old female who is getting outpatient MRI evaluation for suspected metastatic melanoma in which a multiple punctate hemorrhages likely from showering emboli were discovered. Sent here to the emergency Department for evaluate. Patient has no focal neuro deficits.  Discussed with Dr. Roque Cash.  Will get CT angion neck and head and admit to [facility] for  stroke work up including echo.     Final Clinical Impressions(s) / ED Diagnoses   Final diagnoses:  None    New Prescriptions New Prescriptions   No medications on file     Elaf Clauson Julio Alm, MD 07/03/16 0109

## 2016-07-02 NOTE — H&P (Signed)
History and Physical  Patient Name: Victoria Castro     ZOX:[id number]    DOB: [birth year]    DOA: 07/02/2016 PCP: [name], MD   Patient coming from: Radiology     Chief Complaint: Abnormal MRI  HPI: Victoria Castro is a 76 y.o. female with a past medical history significant for metastatic melanoma, hypotyhroidism, HTN who presents with abnormal MRI.  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  She has been suffering for weeks from nausea and minimal PO intake requiring IV fluids repeatedly, not responsive to ondansetron, phenergan, or PPI.  Had an endocscopy the other week that was normal, and this past Monday her Oncologist thought maybe the nausea was from brain mets, so he ordered an outpatient MRI that was done today.  After the scan, the Radiologist noted that there were no metastases as expected but were "patchy restricted diffusion in bilateral occipital lobes, right corpus callosum, left superior cerebellum and pons" suspicious for acute infarct, and so she was sent to the ER.  Per patient and daughter, she has had no recent ataxia, gait disturbance, falls, imabalance, vertigo, facial droop (has chronic facial droop since neck dissection), vision changes.  They note that last Friday after her EGD there was a period of a few hours when she seemed sluggish, daughter (who is former Therapist, sports now Estate agent at [employer]) did neuro checks and they felt it was the IV phenergan she had had that day, and symptoms passed by the next day.  ED course: -About, heart rate 94, respirations and pulse is normal, blood pressure 152/80 -Na 142, K 3.4, Cr 0.73, WBC 17K, Hgb 14.6 -INR normal -Troponin negative -The case was discussed with Neurology who recommended CTA head and neck and then transfer to [facility] for stroke workup     Review of systems:  Review of Systems  Constitutional: Negative for chills, fever and malaise/fatigue.  Eyes: Negative for blurred  vision and double vision.  Gastrointestinal: Positive for nausea. Negative for abdominal pain and vomiting.  Neurological: Negative for dizziness, tingling, sensory change, speech change, focal weakness, seizures, loss of consciousness and headaches.  All other systems reviewed and are negative.        Past Medical History:  Diagnosis Date  . HTN (hypertension)   . Melanoma ([place])   . Protein calorie malnutrition ([place])     Past Surgical History:  Procedure Laterality Date  . BREAST LUMPECTOMY     right  . MELANOMA EXCISION    . THORACOTOMY    . TONSILLECTOMY      Social History: Patient lives alone, recently staying with her daughter.  Patient walks unassisted.  From [place].  Smoked, quit many many years ago.  Was an Therapist, sports.    No Known Allergies  Family history: family history includes Heart disease in her father.  Prior to Admission medications   Medication Sig Start Date End Date Taking? Authorizing Provider  amLODipine-benazepril (LOTREL) 5-20 MG per capsule Take 1 capsule by mouth daily.   Yes Historical Provider, MD  levothyroxine (SYNTHROID, LEVOTHROID) 50 MCG tablet Take 50 mcg by mouth daily before breakfast.   Yes Historical Provider, MD  mirtazapine (REMERON) 15 MG tablet Take 1 tablet (15 mg total) by mouth at bedtime. 05/29/16  Yes Wyatt Portela, MD  ondansetron (ZOFRAN ODT) 8 MG disintegrating tablet Take 1 tablet (8 mg total) by mouth every 8 (eight) hours as needed for nausea or vomiting. 06/11/16  Yes Wyatt Portela, MD  pantoprazole (PROTONIX) 40 MG tablet Take 1 tablet (40 mg total) by mouth 2 (two) times daily. 06/25/16  Yes Jessica D Zehr, PA-C  prochlorperazine (COMPAZINE) 10 MG tablet Take 1 tablet (10 mg total) by mouth every 6 (six) hours as needed for nausea or vomiting. 06/22/16  Yes Wyatt Portela, MD  promethazine (PHENERGAN) 25 MG tablet Take 25 mg by mouth every 6 (six) hours as needed for nausea or vomiting.   Yes Historical Provider, MD     Physical  Exam: BP 144/99 (BP Location: Right Arm)   Pulse 101   Temp 97.6 F (36.4 C) (Oral)   Resp 18   Ht '5\' 3"'$  (1.6 m)   Wt 46.7 kg (103 lb)   SpO2 97%   BMI 18.25 kg/m  General appearance: Well-developed, thin elderly adult female, alert and in no acute distress.   Eyes: Anicteric, conjunctiva pink, lids and lashes normal. PERRL.    ENT: No nasal deformity, discharge, epistaxis.  Hearing diminished in right ear. OP moist without lesions.   Dentition normal. Lymph: No cervical, supraclavicular or axillary lymphadenopathy. Skin: Warm and dry.  No jaundice.  No suspicious rashes or lesions. Cardiac: RRR, nl S1-S2, no murmurs appreciated.  Capillary refill is brisk.  JVP normal.  Nonpitting slight LE edema.  Radial pulses 2+ and symmetric.  No carotid bruits. Respiratory: Normal respiratory rate and rhythm.  CTAB without rales or wheezes. MSK: No deformities or effusions. Neuro: Pupils are 4 mm and reactive to 3 mm. Extraocular movements are intact, without nystagmus. Cranial nerve 5 is WNL. Cranial nerve 7 shows old right facial droop. Cranial nerve 8 is within normal limits except a little HOH old on right. Cranial nerves 9 and 10 reveal equal palate elevation. Cranial nerve 11 reveals sternocleidomastoid strong. Cranial nerve 12 is midline. I do not note a deficit in motor strength testing in the upper and lower extremities bilaterally with normal motor, tone and bulk. Speech is dysarthric at baseline per family patient. Naming is grossly intact. Attention span and concentration are within normal limits.   Psych: The patient is oriented to time, place and person. Behavior appropriate.  Affect pleasant.  Recall, recent and remote, as well as general fund of knowledge seem within normal limits. No evidence of aural or visual hallucinations or delusions.       Labs on Admission:  I have personally reviewed following labs and imaging studies: CBC:  Recent Labs Lab 06/29/16 0846  07/02/16 2120  WBC 12.3* 17.0*  NEUTROABS 9.9* 14.2*  HGB 14.1 14.6  HCT 42.9 44.6  MCV 81.6 80.9  PLT 201 938*   Basic Metabolic Panel:  Recent Labs Lab 06/29/16 0846 07/02/16 2120  NA 142 142  K 3.4* 3.4*  CL  --  103  CO2 32* 28  GLUCOSE 137 123*  BUN 10.8 17  CREATININE 0.6 0.73  CALCIUM 9.4 9.2   GFR: Estimated Creatinine Clearance: 43.4 mL/min (by C-G formula based on SCr of 0.73 mg/dL). Liver Function Tests:  Recent Labs Lab 06/29/16 0846 07/02/16 2120  AST 25 33  ALT 12 22  ALKPHOS 113 104  BILITOT 0.73 0.8  PROT 6.0* 6.1*  ALBUMIN 2.6* 3.0*   No results for input(s): LIPASE, AMYLASE in the last 168 hours. No results for input(s): AMMONIA in the last 168 hours. Coagulation Profile:  Recent Labs Lab 07/02/16 2120  INR 1.09   Cardiac Enzymes: No results for input(s): CKTOTAL, CKMB, CKMBINDEX,  TROPONINI in the last 168 hours. BNP (last 3 results) No results for input(s): PROBNP in the last 8760 hours. HbA1C: No results for input(s): HGBA1C in the last 72 hours. CBG: No results for input(s): GLUCAP in the last 168 hours. Lipid Profile: No results for input(s): CHOL, HDL, LDLCALC, TRIG, CHOLHDL, LDLDIRECT in the last 72 hours. Thyroid Function Tests: No results for input(s): TSH, T4TOTAL, FREET4, T3FREE, THYROIDAB in the last 72 hours. Anemia Panel: No results for input(s): VITAMINB12, FOLATE, FERRITIN, TIBC, IRON, RETICCTPCT in the last 72 hours. Sepsis Labs: Invalid input(s): PROCALCITONIN, LACTICIDVEN No results found for this or any previous visit (from the past 240 hour(s)).    Radiological Exams on Admission: Personally reviewed MR report: Mr Jeri Cos QI Contrast  Result Date: 07/02/2016 CLINICAL DATA:  Melanoma.  Evaluate for metastases.  Nausea. EXAM: MRI HEAD WITHOUT AND WITH CONTRAST TECHNIQUE: Multiplanar, multiecho pulse sequences of the brain and surrounding structures were obtained without and with intravenous contrast. CONTRAST:   42m MULTIHANCE GADOBENATE DIMEGLUMINE 529 MG/ML IV SOLN COMPARISON:  None. FINDINGS: Brain: Patchy restricted diffusion throughout the bilateral occipital lobes, right posterior corpus callosum, left superior cerebellum/upper vermis, and right posterior pons. The most confluent area of infarction is a moderate area in the left occipital pole measuring up to 35 mm. Patchy superficial enhancement in the occipital lobes attributed to subacute infarct. When accounting for probable ischemic enhancement, no brain metastasis is suspected. No hemorrhage or hydrocephalus. Small remote inferior left cerebellar infarct. Vascular: Preserved major vessel flow voids. Small developmental venous anomaly in the left cerebellum. Skull and upper cervical spine: No marrow lesion noted. Sinuses/Orbits: Negative Other: The patient's daughter is being made aware of the stroke findings by peforming technologist, with [facility] I have communicated with the [place] who is expecting her arrival. IMPRESSION: 1. Numerous acute and subacute infarcts in the posterior circulation as described above. This outpatient is being referred to the ER. 2. Bilateral occipital enhancement is attributed to subacute infarct. No suspected brain metastasis, but suggest follow-up in 2-3 months at which time ischemic enhancement should be resolved. Electronically Signed   By: JMonte FantasiaM.D.   On: 07/02/2016 20:24     EKG: Independently reviewed. Rate 95, QTc >500, nonspecific TW changes, no ST changes.    Assessment/Plan  1. Acute Stroke:  This is new.  MRI shows multiple bilateral small acute infarcts.  No fevers to suggest endocarditis, not sure that melanoma causes marantic vegs.  CTA imaging pending -Admit to telemetry -Neuro checks, NIHSS per protocol -Daily aspirin 325 mg for now -Lipids, hemoglobin A1c ordered -CT angiography of head and neck per Neurology,  ordered -Echocardiogram ordered -PT/OT/SLP consultation -Consult to Neurology, appreciate recommendations   2. Metastatic melanoma:  -Continue ondansetron, mirtazapine, PPI -Stop dexamethasone, since there are no mets to brain  3. HTN:  Hypertensive at admission. -Continue amlodipine-ACEi  4. Hypothyroidism:  Stable. -Continue levothyroxine  5. Prolonged QT: -Caution with ondansetron -Replete K -Monitor on telemetry        DVT prophylaxis: Lovenox  Code Status: FULL  Family Communication: Daughter at bedside  Disposition Plan: Anticipate Stroke work up as above and consult to ancillary services.  Expect discharge within 2-3 days. Consults called: Neurology, Dr. KLeonel Ramsaywill see the patient. Admission status: Telemetry, INPATIENT status  Core measures: -VTE prophylaxis ordered at time of admission -Aspirin ordered at admission -Atrial fibrillation: not known -tPA not given because of outside stroke window -  Dysphagia screen ordered in ER -Lipids ordered -PT eval ordered -Non-smoker    Medical decision making: Patient seen at 11:12 PM on 07/02/2016.  The patient was discussed with Dr. Thomasene Lot. What exists of the patient's chart was reviewed in depth and summarized above.  Clinical condition: stable.       Edwin Dada [facility] Pager [phone]

## 2016-07-02 NOTE — ED Notes (Signed)
Bed: WA25 Expected date:  Expected time:  Means of arrival:  Comments: Triage 2 

## 2016-07-02 NOTE — ED Notes (Signed)
Pt in Ct  

## 2016-07-02 NOTE — ED Notes (Signed)
Provider in room  

## 2016-07-02 NOTE — ED Triage Notes (Signed)
Pt brought from out patient radiology for PET scan and found many embolic strokes.

## 2016-07-03 ENCOUNTER — Encounter: Payer: Self-pay | Admitting: *Deleted

## 2016-07-03 ENCOUNTER — Inpatient Hospital Stay (HOSPITAL_COMMUNITY): Payer: Medicare Other

## 2016-07-03 ENCOUNTER — Ambulatory Visit: Payer: Medicare Other | Admitting: Nurse Practitioner

## 2016-07-03 DIAGNOSIS — I639 Cerebral infarction, unspecified: Secondary | ICD-10-CM

## 2016-07-03 DIAGNOSIS — I6340 Cerebral infarction due to embolism of unspecified cerebral artery: Secondary | ICD-10-CM

## 2016-07-03 DIAGNOSIS — E43 Unspecified severe protein-calorie malnutrition: Secondary | ICD-10-CM | POA: Insufficient documentation

## 2016-07-03 LAB — LIPID PANEL
CHOL/HDL RATIO: 4.4 ratio
CHOLESTEROL: 189 mg/dL (ref 0–200)
HDL: 43 mg/dL (ref >40)
LDL Cholesterol: 92 mg/dL (ref 0–99)
TRIGLYCERIDES: 270 mg/dL — AB (ref <150)
VLDL: 54 mg/dL — ABNORMAL HIGH (ref 0–40)

## 2016-07-03 LAB — BASIC METABOLIC PANEL
Anion gap: 12 (ref 5–15)
BUN: 11 mg/dL (ref 6–20)
CALCIUM: 9.3 mg/dL (ref 8.9–10.3)
CHLORIDE: 103 mmol/L (ref 101–111)
CO2: 26 mmol/L (ref 22–32)
CREATININE: 0.55 mg/dL (ref 0.44–1.00)
GFR calc Af Amer: >60 mL/min (ref >60)
GFR calc non Af Amer: >60 mL/min (ref >60)
GLUCOSE: 128 mg/dL — AB (ref 65–99)
Potassium: 3.6 mmol/L (ref 3.5–5.1)
Sodium: 141 mmol/L (ref 135–145)

## 2016-07-03 LAB — ECHOCARDIOGRAM COMPLETE
Height: 63 in
Weight: 1648 oz

## 2016-07-03 LAB — CBC
HEMATOCRIT: 45.6 % (ref 36.0–46.0)
HEMOGLOBIN: 14.7 g/dL (ref 12.0–15.0)
MCH: 26.8 pg (ref 26.0–34.0)
MCHC: 32.2 g/dL (ref 30.0–36.0)
MCV: 83.2 fL (ref 78.0–100.0)
Platelets: 142 10*3/uL — ABNORMAL LOW (ref 150–400)
RBC: 5.48 MIL/uL — ABNORMAL HIGH (ref 3.87–5.11)
RDW: 16.2 % — AB (ref 11.5–15.5)
WBC: 14.2 10*3/uL — ABNORMAL HIGH (ref 4.0–10.5)

## 2016-07-03 MED ORDER — ACETAMINOPHEN 325 MG PO TABS
650.0000 mg | ORAL_TABLET | ORAL | Status: DC | PRN
  Administered 2016-07-03: 650 mg via ORAL
  Filled 2016-07-03: qty 2

## 2016-07-03 MED ORDER — POTASSIUM CHLORIDE CRYS ER 20 MEQ PO TBCR
40.0000 meq | EXTENDED_RELEASE_TABLET | Freq: Once | ORAL | Status: AC
  Administered 2016-07-03: 40 meq via ORAL
  Filled 2016-07-03: qty 2

## 2016-07-03 MED ORDER — PANTOPRAZOLE SODIUM 40 MG PO TBEC
40.0000 mg | DELAYED_RELEASE_TABLET | Freq: Two times a day (BID) | ORAL | Status: DC
  Administered 2016-07-03: 40 mg via ORAL
  Filled 2016-07-03: qty 1

## 2016-07-03 MED ORDER — AMLODIPINE BESY-BENAZEPRIL HCL 5-20 MG PO CAPS: 1.0000 | ORAL_CAPSULE | Freq: Every day | ORAL | 0 refills | Status: AC

## 2016-07-03 MED ORDER — AMLODIPINE BESYLATE 5 MG PO TABS: 5.0000 mg | ORAL_TABLET | Freq: Every day | ORAL | Status: DC

## 2016-07-03 MED ORDER — ENOXAPARIN SODIUM 40 MG/0.4ML ~~LOC~~ SOLN: 40.0000 mg | Freq: Two times a day (BID) | SUBCUTANEOUS | 0 refills | Status: DC

## 2016-07-03 MED ORDER — ENOXAPARIN SODIUM 60 MG/0.6ML ~~LOC~~ SOLN: 1.0000 mg/kg | Freq: Two times a day (BID) | SUBCUTANEOUS | Status: DC

## 2016-07-03 MED ORDER — SODIUM CHLORIDE 0.9 % IV SOLN
INTRAVENOUS | Status: DC
  Administered 2016-07-03: 12:00:00 via INTRAVENOUS

## 2016-07-03 MED ORDER — ENOXAPARIN SODIUM 40 MG/0.4ML ~~LOC~~ SOLN
40.0000 mg | Freq: Every day | SUBCUTANEOUS | Status: DC
  Administered 2016-07-03: 40 mg via SUBCUTANEOUS
  Filled 2016-07-03: qty 0.4

## 2016-07-03 MED ORDER — ENOXAPARIN (LOVENOX) PATIENT EDUCATION KIT
PACK | Freq: Once | Status: DC
  Filled 2016-07-03: qty 1

## 2016-07-03 MED ORDER — ACETAMINOPHEN 160 MG/5ML PO SOLN: 650.0000 mg | ORAL | Status: DC | PRN

## 2016-07-03 MED ORDER — STROKE: EARLY STAGES OF RECOVERY BOOK
Freq: Once | Status: AC
  Administered 2016-07-03: 07:00:00
  Filled 2016-07-03: qty 1

## 2016-07-03 MED ORDER — ASPIRIN 325 MG PO TABS
325.0000 mg | ORAL_TABLET | Freq: Every day | ORAL | Status: DC
  Administered 2016-07-03: 325 mg via ORAL
  Filled 2016-07-03: qty 1

## 2016-07-03 MED ORDER — ASPIRIN 300 MG RE SUPP: 300.0000 mg | Freq: Every day | RECTAL | Status: DC

## 2016-07-03 MED ORDER — MIRTAZAPINE 15 MG PO TABS: 15.0000 mg | ORAL_TABLET | Freq: Every day | ORAL | Status: DC

## 2016-07-03 MED ORDER — LEVOTHYROXINE SODIUM 50 MCG PO TABS
50.0000 ug | ORAL_TABLET | Freq: Every day | ORAL | Status: DC
  Administered 2016-07-03: 50 ug via ORAL
  Filled 2016-07-03: qty 1

## 2016-07-03 MED ORDER — ACETAMINOPHEN 650 MG RE SUPP: 650.0000 mg | RECTAL | Status: DC | PRN

## 2016-07-03 MED ORDER — CALCIUM CARBONATE ANTACID 500 MG PO CHEW
1.0000 | CHEWABLE_TABLET | Freq: Two times a day (BID) | ORAL | Status: DC
  Administered 2016-07-03: 200 mg via ORAL
  Filled 2016-07-03: qty 1

## 2016-07-03 MED ORDER — AMLODIPINE BESY-BENAZEPRIL HCL 5-20 MG PO CAPS: 1.0000 | ORAL_CAPSULE | Freq: Every day | ORAL | Status: DC

## 2016-07-03 MED ORDER — POLYETHYLENE GLYCOL 3350 17 G PO PACK
17.0000 g | PACK | Freq: Every day | ORAL | Status: DC
  Administered 2016-07-03: 17 g via ORAL
  Filled 2016-07-03: qty 1

## 2016-07-03 MED ORDER — BENAZEPRIL HCL 20 MG PO TABS: 20.0000 mg | ORAL_TABLET | Freq: Every day | ORAL | Status: DC

## 2016-07-03 MED ORDER — ONDANSETRON 4 MG PO TBDP: 8.0000 mg | ORAL_TABLET | Freq: Three times a day (TID) | ORAL | Status: DC | PRN

## 2016-07-03 NOTE — Progress Notes (Addendum)
ANTICOAGULATION CONSULT NOTE - Initial Consult  Pharmacy Consult for Lovenox Indication: stroke and hypercoagulable state  No Known Allergies  Patient Measurements: Height: '5\' 3"'$  (160 cm) Weight: 103 lb (46.7 kg) IBW/kg (Calculated) : 52.4   Vital Signs: Temp: 97.3 F (36.3 C) (02/16 1139) Temp Source: Oral (02/16 1139) BP: 140/80 (02/16 1139) Pulse Rate: 94 (02/16 1139)  Labs:  Recent Labs  07/02/16 2120 07/03/16 0844  HGB 14.6 14.7  HCT 44.6 45.6  PLT 135* 142*  LABPROT 14.2  --   INR 1.09  --   CREATININE 0.73 0.55    Estimated Creatinine Clearance: 43.4 mL/min (by C-G formula based on SCr of 0.55 mg/dL).   Medical History: Past Medical History:  Diagnosis Date  . HTN (hypertension)   . Melanoma ([place])   . Protein calorie malnutrition ([place])     Medications:  Facility-Administered Medications Prior to Admission  Medication Dose Route Frequency Provider Last Rate Last Dose  . 0.9 %  sodium chloride infusion  500 mL Intravenous Continuous Nelida Meuse III, MD       Prescriptions Prior to Admission  Medication Sig Dispense Refill Last Dose  . amLODipine-benazepril (LOTREL) 5-20 MG per capsule Take 1 capsule by mouth daily.   07/02/2016 at Unknown time  . levothyroxine (SYNTHROID, LEVOTHROID) 50 MCG tablet Take 50 mcg by mouth daily before breakfast.   07/02/2016 at Unknown time  . mirtazapine (REMERON) 15 MG tablet Take 1 tablet (15 mg total) by mouth at bedtime. 30 tablet 1 Past Month at Unknown time  . ondansetron (ZOFRAN ODT) 8 MG disintegrating tablet Take 1 tablet (8 mg total) by mouth every 8 (eight) hours as needed for nausea or vomiting. 30 tablet 1 Past Month at Unknown time  . pantoprazole (PROTONIX) 40 MG tablet Take 1 tablet (40 mg total) by mouth 2 (two) times daily. 60 tablet 2 07/02/2016 at Unknown time  . prochlorperazine (COMPAZINE) 10 MG tablet Take 1 tablet (10 mg total) by mouth every 6 (six) hours as needed for nausea or vomiting. 30 tablet 1  Past Month at Unknown time  . promethazine (PHENERGAN) 25 MG tablet Take 25 mg by mouth every 6 (six) hours as needed for nausea or vomiting.   Past Week at Unknown time   Scheduled:  . calcium carbonate  1 tablet Oral BID  . levothyroxine  50 mcg Oral QAC breakfast  . mirtazapine  15 mg Oral QHS  . pantoprazole  40 mg Oral BID  . polyethylene glycol  17 g Oral Daily   Infusions:  . sodium chloride 75 mL/hr at 07/03/16 1137    Assessment: 76yo female with history of melanoma and HTN presented with abnormal MRI. Pharmacy is consulted to dose lovenox for CVA and hypercoagulable state. Patient received lovenox '40mg'$  once at 1037.  Goal of Therapy:  Monitor platelets by anticoagulation protocol: Yes   Plan:  Lovenox '1mg'$ /kg subcutaneously q12h Monitor s/sx of bleeding F/u long term anticoag plan  Andrey Cota. Diona Foley, PharmD, BCPS Clinical Pharmacist [phone] 07/03/2016,3:38 PM

## 2016-07-03 NOTE — Progress Notes (Signed)
STROKE TEAM PROGRESS NOTE   HISTORY OF PRESENT ILLNESS (per record) Victoria Castro is a 76 y.o. 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 She states that she may have had some mild difficulty with her vision, but nothing that she would've complained about. Once the MRI was performed, patient was called and directed to come to the emergency room which she did. Her last known well is unknown. Patient was not administered IV t-PA secondary to unknown last known well. She was admitted for further evaluation and treatment.   SUBJECTIVE (INTERVAL HISTORY) Daughter at bedside. Echo tech is performing TTE at bedside. Pt cachectic but no acute neuro deficit, asking for going home. LE venous doppler negative. Concerning stroke is due to metastatic melanoma, recommend anticoagulation.     OBJECTIVE Temp:  [97.4 F (36.3 C)-97.7 F (36.5 C)] 97.5 F (36.4 C) (02/16 0939) Pulse Rate:  [85-103] 85 (02/16 0939) Cardiac Rhythm: Sinus tachycardia (02/16 0700) Resp:  [18-22] 18 (02/16 0339) BP: (115-161)/(65-112) 139/65 (02/16 0939) SpO2:  [96 %-99 %] 96 % (02/16 0939) Weight:  [46.7 kg (103 lb)] 46.7 kg (103 lb) (02/15 2031)  CBC:  Recent Labs Lab 06/29/16 0846 07/02/16 2120 07/03/16 0844  WBC 12.3* 17.0* 14.2*  NEUTROABS 9.9* 14.2*  --   HGB 14.1 14.6 14.7  HCT 42.9 44.6 45.6  MCV 81.6 80.9 83.2  PLT 201 135* 142*    Basic Metabolic Panel:  Recent Labs Lab 07/02/16 2120 07/03/16 0844  NA 142 141  K 3.4* 3.6  CL 103 103  CO2 28 26  GLUCOSE 123* 128*  BUN 17 11  CREATININE 0.73 0.55  CALCIUM 9.2 9.3    Lipid Panel:    Component Value Date/Time   CHOL 189 07/03/2016 0431   TRIG 270 (H) 07/03/2016 0431   HDL 43 07/03/2016 0431   CHOLHDL 4.4 07/03/2016 0431   VLDL 54 (H) 07/03/2016 0431   LDLCALC 92 07/03/2016 0431   HgbA1c: No results found for:  HGBA1C Urine Drug Screen: No results found for: LABOPIA, COCAINSCRNUR, LABBENZ, AMPHETMU, THCU, LABBARB    IMAGING I have personally reviewed the radiological images below and agree with the radiology interpretations.  CT HEAD 07/03/2016 Acute LEFT greater than RIGHT posterior circulation infarcts, no hemorrhagic conversion. Old LEFT cerebellum and LEFT basal ganglia infarcts.   Ct Angio Head W Or Wo Contrast 07/03/2016 No emergent large vessel or occlusion or severe stenosis. Moderate intracranial atherosclerosis and scattered stenoses.   Ct Angio Neck W And/or Wo Contrast 07/03/2016 Atherosclerosis without hemodynamically significant stenosis or acute vascular process. Mediastinal lymphadenopathy better characterized on PET- CT May 28, 2016   Mr Jeri Cos Wo Contrast 07/02/2016 1. Numerous acute and subacute infarcts in the posterior circulation as described above. This outpatient is being referred to the ER. 2. Bilateral occipital enhancement is attributed to subacute infarct. No suspected brain metastasis, but suggest follow-up in 2-3 months at which time ischemic enhancement should be resolved.   Dg Chest 2 View 07/03/2016 No active cardiopulmonary disease.   LE venous doppler  There is no evidence of deep or superficial vein thrombosis involving the right and left lower extremities. All visualized vessels appear patent and compressible. There is no evidence of Baker's cysts bilaterally.  TTE  Left ventricle: Wall thickness was increased in a pattern of mild   LVH. There was focal basal hypertrophy. Systolic function was   mildly  to moderately reduced. The estimated ejection fraction was   in the range of 40% to 45%. Features are consistent with a   pseudonormal left ventricular filling pattern, with concomitant   abnormal relaxation and increased filling pressure (grade 2   diastolic dysfunction). - Aortic valve: Moderately calcified annulus. - Mitral valve: Mildly to  moderately calcified annulus. - Pulmonary arteries: Systolic pressure was mildly increased. PA   peak pressure: 36 mm Hg (S). - Pericardium, extracardiac: A small pericardial effusion was   identified circumferential to the heart.   PHYSICAL EXAM  Temp:  [97.3 F (36.3 C)-97.5 F (36.4 C)] 97.3 F (36.3 C) (02/16 1139) Pulse Rate:  [85-97] 94 (02/16 1139) Resp:  [18-22] 18 (02/16 0339) BP: (115-158)/(65-96) 140/80 (02/16 1139) SpO2:  [96 %-99 %] 97 % (02/16 1139)  General - Well nourished, well developed, in no apparent distress.  Ophthalmologic - Fundi not visualized due to eye movement.  Cardiovascular - Regular rate and rhythm.  Mental Status -  Level of arousal and orientation to time, place, and person were intact. Language including expression, naming, repetition, comprehension was assessed and found intact. Attention span and concentration were normal. Fund of Knowledge was assessed and was intact.  Cranial Nerves II - XII - II - Visual field intact OU. III, IV, VI - Extraocular movements intact. V - Facial sensation intact bilaterally. VII - right facial droop which was chronic after right neck lymph node dissection. VIII - Hearing & vestibular intact bilaterally. X - Palate elevates symmetrically. XI - Chin turning & shoulder shrug intact bilaterally. XII - Tongue protrusion intact.  Motor Strength - The patient's strength was normal in all extremities and pronator drift was absent.  Bulk was normal and fasciculations were absent.   Motor Tone - Muscle tone was assessed at the neck and appendages and was normal.  Reflexes - The patient's reflexes were 1+ in all extremities and she had no pathological reflexes.  Sensory - Light touch, temperature/pinprick were assessed and were symmetrical.    Coordination - The patient had normal movements in the hands with no ataxia or dysmetria.  Tremor was absent.  Gait and Station - not tested for safety  concerns   ASSESSMENT/PLAN Victoria Castro is a 76 y.o. female with history of melanoma presenting following an MRI screening which found an incidental posterior circulation infarct. She did not receive IV t-PA due to unknown last known well.   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 Old left cerebellar and left basal ganglia infarcts   CTA head and neck no LVO, moderate intracranial atherosclerosis, and scattered stenosis    MRI  numerous acute and subacute infarcts in the posterior circulation. No brain metastasis.  2D Echo  EF 40-45%  Lower extremity Doppler no DVT  LDL 92  HgbA1c pending  Lovenox 40 mg sq daily for VTE prophylaxis  Diet Heart Room service appropriate? Yes; Fluid consistency: Thin  No antithrombotic prior to admission, now on aspirin 325 mg daily. Recommend anticoagulation for stroke prevention. OK with lovenox therapeutic dose.   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 Alen Blew  Hypothyroidism  Prolonged QT  Hospital day # 1  Neurology will sign off. Please call with questions. Pt will follow up with Dr. Erlinda Hong at [facility] in about 6 weeks. Thanks for the consult.  Rosalin Hawking, MD PhD Stroke Neurology 07/04/2016 1:20 AM   To contact Stroke Continuity  provider, please refer to [email or web address] After hours, contact General Neurology

## 2016-07-03 NOTE — Care Management Note (Addendum)
Case Management Note  Patient Details  Name: Victoria Castro MRN: [id number] Date of Birth: [birth year]  Subjective/Objective:                    Action/Plan: Plan is for patient to discharge to daughters home with [facility] services when medically ready. CM met with the patient and her daughter, Coralyn Mark, and provided a list of [place] agencies. Coralyn Mark selected [place]. Santiago Glad with [facility] notified and accepted the referral. Daughters address and phone number provided to [place]. Daughter to provide transportation home.   Addendum: (1282):  Pt discharging on Lovenox. CM unable to do benefits check at this hour. Family agreeable to taking to pharmacy and believe with her drug coverage that the cost will be affordable.   Expected Discharge Date:  07/04/16               Expected Discharge Plan:  [place]  In-House Referral:     Discharge planning Services  CM Consult  Post Acute Care Choice:  Home Health Choice offered to:  Patient, Adult Children  DME Arranged:    DME Agency:     HH Arranged:  PT [place]:  [place]  Status of Service:  Completed, signed off  If discussed at [place] of Stay Meetings, dates discussed:    Additional Comments:  Pollie Friar, RN 07/03/2016, 3:11 PM

## 2016-07-03 NOTE — Consult Note (Signed)
Neurology Consultation Reason for Consult: Stroke Referring Physician: Thomasene Lot, C  CC: Stroke  History is obtained from: Patient  HPI: Victoria Castro is a 76 y.o. 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 She states that she may have had some mild difficulty with her vision, but nothing that she would've complained about.  Once the MRI was performed, patient was called and directed to come to the emergency room which she did.   LKW: Unclear tpa given?: no, unclear time of onset    ROS: A 14 point ROS was performed and is negative except as noted in the HPI.   Past Medical History:  Diagnosis Date  . HTN (hypertension)   . Melanoma ([place])   . Protein calorie malnutrition ([place])      Family History  Problem Relation Age of Onset  . Heart disease Father   . Esophageal cancer Neg Hx   . Colon cancer Neg Hx   . Colon polyps Neg Hx   . Stomach cancer Neg Hx   . Rectal cancer Neg Hx      Social History:  reports that she quit smoking about 43 years ago. Her smoking use included Cigarettes. She started smoking about 59 years ago. She has a 9.00 pack-year smoking history. She has never used smokeless tobacco. She reports that she does not drink alcohol or use drugs.   Exam: Current vital signs: BP (!) 144/77 (BP Location: Right Arm)   Pulse 88   Temp 97.5 F (36.4 C) (Oral)   Resp 18   Ht '5\' 3"'$  (1.6 m)   Wt 46.7 kg (103 lb)   SpO2 98%   BMI 18.25 kg/m  Vital signs in last 24 hours: Temp:  [97.4 F (36.3 C)-97.7 F (36.5 C)] 97.5 F (36.4 C) (02/16 0339) Pulse Rate:  [88-103] 88 (02/16 0339) Resp:  [18-22] 18 (02/16 0339) BP: (144-161)/(77-112) 144/77 (02/16 0339) SpO2:  [96 %-99 %] 98 % (02/16 0339) Weight:  [46.7 kg (103 lb)] 46.7 kg (103 lb) (02/15 2031)   Physical Exam  Constitutional: Appears well-developed and well-nourished.  Psych:  Affect appropriate to situation Eyes: No scleral injection HENT: No OP obstrucion Head: Normocephalic.  Cardiovascular: Normal rate and regular rhythm.  Respiratory: Effort normal and breath sounds normal to anterior ascultation GI: Soft.  No distension. There is no tenderness.  Skin: WDI  Neuro: Mental Status: Patient is awake, alert, oriented to person, place, month, year, and situation. Patient is able to give a clear and coherent history. No signs of aphasia or neglect Cranial Nerves: II: She has difficulty counting in the right lower visual field, but is able to see finger movement. Pupils are equal, round, and reactive to light.   III,IV, VI: EOMI without ptosis or diploplia.  V: Facial sensation is symmetric to temperature VII: Facial movement is symmetric.  VIII: hearing is intact to voice X: Uvula elevates symmetrically XI: Shoulder shrug is symmetric. XII: tongue is midline without atrophy or fasciculations.  Motor: Tone is normal. Bulk is normal. 5/5 strength was present in all four extremities.  Sensory: Sensation is symmetric to light touch and temperature in the arms and legs. Cerebellar: FNF intact bilaterally    I have reviewed labs in epic and the results pertinent to this consultation are: CMP-unremarkable  I have reviewed the images obtained: MRI brain-multifocal posterior circulation infarct  Impression: 76 year old female with infarct that I  suspect was likely an embolus the broke up into multiple pieces, however recurrent emboli from posterior circulation difficult to exclude. No definite source on CTA, however.  Recommendations: 1. HgbA1c, fasting lipid panel 2. Frequent neuro checks 3. Echocardiogram 4. Prophylactic therapy-Antiplatelet med: Aspirin - dose '325mg'$  PO r '300mg'$  PR 5. Risk factor modification 6. Telemetry monitoring 7. PT consult, OT consult, Speech consult 8. please page stroke NP  Or  PA  Or MD  from 8am -4 pm starting 2/16 as this  patient will be followed by the stroke team at this point.   You can look them up on [email or web address]      Roland Rack, MD [facility] [phone]  If 7pm- 7am, please page neurology on call as listed in [place].

## 2016-07-03 NOTE — Discharge Summary (Signed)
Physician Discharge Summary  Victoria Castro ION:[id number] DOB: 09/03/1940 DOA: 07/02/2016  PCP: [name], MD  Admit date: 07/02/2016 Discharge date: 07/03/2016  Admitted From: Home  Disposition: Home   Recommendations for Outpatient Follow-up:  1. Follow up with PCP in 1-2 weeks 2. Please obtain BMP/CBC in one week 3. Please follow up on the following pending results: Blood culture.   Home Health; Yes.   Discharge Condition: Stable.  CODE STATUS: Full code.  Diet recommendation: Heart Healthy   Brief/Interim Summary:  Victoria Castro is a 76 y.o. female with a past medical history significant for metastatic melanoma, hypotyhroidism, HTN who presents with abnormal MRI.  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  She has been suffering for weeks from nausea and minimal PO intake requiring IV fluids repeatedly, not responsive to ondansetron, phenergan, or PPI.  Had an endocscopy the other week that was normal, and this past Monday her Oncologist thought maybe the nausea was from brain mets, so he ordered an outpatient MRI that was done today.  After the scan, the Radiologist noted that there were no metastases as expected but were "patchy restricted diffusion in bilateral occipital lobes, right corpus callosum, left superior cerebellum and pons" suspicious for acute infarct, and so she was sent to the ER.  Per patient and daughter, she has had no recent ataxia, gait disturbance, falls, imabalance, vertigo, facial droop (has chronic facial droop since neck dissection), vision changes.  They note that last Friday after her EGD there was a period of a few hours when she seemed sluggish, daughter (who is former Therapist, sports now Estate agent at [employer]) did neuro checks and they felt it was the IV phenergan she had had that day, and symptoms passed by the next day.  Discharge Diagnoses:  Principal Problem:   Acute embolic stroke [facility]) Active Problems:    Metastatic melanoma ([place])   Hypokalemia   Hypothyroidism, acquired   Essential hypertension   Protein-calorie malnutrition, severe  1-Acute, Sub - Acute  multiples stroke;  Patient was admitted to telemetry. EKG sinus rhythm, MRI positive for posterior circulation stroke. Doppler lower extremity negative for DVT. Case discussed with Dr Erlinda Hong, due to patient known malignancy and hypercoagulable states, stroke is likely embolic in nature. Dr Erlinda Hong recommend Lovenox vs coumadin for stroke prevention. Options discussed with patient, she would like to proceed with Lovenox. Prescriptions will be provided.  For elevation of LDL and triglycerides, would not start statins at this time due to history of metastasis to liver and poor oral and nutrition states. Patient agrees with this.  -Please follow Blood cultures.  -ECHO ; Ef 45 %. Diastolic dysfunction. Patient asymptomatic. Follow up with cardiology as needed.   2-Leukocytosis; suspect related to steroids. Chest x ray negative. Patient afebrile.   3-Metastatic melanoma; Follow with Dr Alen Blew.   4. Hypothyroidism:  -Continue levothyroxine  5-HTN; Continue amlodipine-ACEi, holder parameter for hypotension provided to family.   Discharge Instructions  Discharge Instructions    Diet - low sodium heart healthy    Complete by:  As directed    Increase activity slowly    Complete by:  As directed      Allergies as of 07/03/2016   No Known Allergies     Medication List    TAKE these medications   amLODipine-benazepril 5-20 MG capsule Commonly known as:  LOTREL Take 1 capsule by mouth daily. Hold for SBP less than 130 What changed:  additional  instructions   enoxaparin 40 MG/0.4ML injection Commonly known as:  LOVENOX Inject 0.4 mLs (40 mg total) into the skin every 12 (twelve) hours.   levothyroxine 50 MCG tablet Commonly known as:  SYNTHROID, LEVOTHROID Take 50 mcg by mouth daily before breakfast.   mirtazapine 15 MG tablet Commonly  known as:  REMERON Take 1 tablet (15 mg total) by mouth at bedtime.   ondansetron 8 MG disintegrating tablet Commonly known as:  ZOFRAN ODT Take 1 tablet (8 mg total) by mouth every 8 (eight) hours as needed for nausea or vomiting.   pantoprazole 40 MG tablet Commonly known as:  PROTONIX Take 1 tablet (40 mg total) by mouth 2 (two) times daily.   prochlorperazine 10 MG tablet Commonly known as:  COMPAZINE Take 1 tablet (10 mg total) by mouth every 6 (six) hours as needed for nausea or vomiting.   promethazine 25 MG tablet Commonly known as:  PHENERGAN Take 25 mg by mouth every 6 (six) hours as needed for nausea or vomiting.       No Known Allergies  Consultations:  Neurology    Procedures/Studies: Ct Angio Head W Or Wo Contrast  Result Date: 07/03/2016 CLINICAL DATA:  Nausea for several weeks. History of metastatic melanoma, hypertension. Follow-up acute posterior circulation infarcts. EXAM: CT ANGIOGRAPHY HEAD AND NECK TECHNIQUE: Multidetector CT imaging of the head and neck was performed using the standard protocol during bolus administration of intravenous contrast. Multiplanar CT image reconstructions and MIPs were obtained to evaluate the vascular anatomy. Carotid stenosis measurements (when applicable) are obtained utilizing NASCET criteria, using the distal internal carotid diameter as the denominator. CONTRAST:  80 cc Isovue 370 COMPARISON:  MRI of the head July 02, 2016 and PET CT May 28, 2016 FINDINGS: CT HEAD FINDINGS BRAIN: Wedge-like hypodensity LEFT temporal and occipital lobes corresponding to acute infarct. Patchy hypodensities in the LEFT inferior cerebellum. Focal hypodensity cyst RIGHT sagittal splenium of the corpus callosum. Ventricles and sulci are normal for patient's age. No intraparenchymal hemorrhage, mass effect. Old LEFT basal ganglia lacunar infarct. Ventricles and sulci are normal for patient's age. No abnormal extra-axial fluid collections.  VASCULAR: Mild calcific atherosclerosis of the carotid siphons. SKULL: No skull fracture. No significant scalp soft tissue swelling. SINUSES/ORBITS: The mastoid air-cells and included paranasal sinuses are well-aerated. Status post bilateral ocular lens implants. The included ocular globes and orbital contents are non-suspicious. OTHER: None. CTA NECK AORTIC ARCH: Mild calcific atherosclerosis of the aortic arch, 2 vessel arch is a normal variant. Mild stenosis LEFT subclavian artery origin due to eccentric calcific atherosclerosis. Mild stenosis proximal RIGHT subclavian artery. Origins of the innominate, left Common carotid artery and subclavian artery are widely patent. RIGHT CAROTID SYSTEM: Common carotid artery is widely patent, coursing in a straight line fashion. Mild eccentric calcific atherosclerosis of the carotid bifurcation without hemodynamically significant stenosis by NASCET criteria. Normal appearance of the included internal carotid artery. LEFT CAROTID SYSTEM: Common carotid artery is widely patent, coursing in a straight line fashion. Mild eccentric calcific atherosclerosis of the carotid bifurcation without hemodynamically significant stenosis by NASCET criteria. Normal appearance of the included internal carotid artery. VERTEBRAL ARTERIES:RIGHT vertebral artery is dominant. Normal appearance of the vertebral arteries, which appear widely patent. SKELETON: No acute osseous process though bone windows have not been submitted. Osteopenia. Moderate degenerative changes cervical spine without destructive bony lesions. Patient is edentulous. OTHER NECK: Re- demonstration of LEFT LEFT aortopulmonary window pathologic lymphadenopathy at 3.2 cm. Heterogeneous thyroid gland without dominant nodule. CTA HEAD  ANTERIOR CIRCULATION: Normal appearance of the cervical internal carotid arteries, petrous, cavernous and supra clinoid internal carotid arteries. Widely patent anterior communicating artery. Moderate  stenosis RIGHT A1 origin. Patent anterior communicating artery. Patent anterior cerebral artery does mild luminal irregularity. Tandem mild to moderate stenoses bilateral mid and distal segments. No large vessel occlusion, hemodynamically significant stenosis, dissection, luminal irregularity, contrast extravasation or aneurysm. POSTERIOR CIRCULATION: Patent vertebral arteries, vertebrobasilar junction and basilar artery, as well as main branch vessels. Mild stenosis mid basilar artery. Patent posterior cerebral arteries. Moderate tandem stenoses bilateral posterior cerebral arteries. No large vessel occlusion, hemodynamically significant stenosis, dissection, luminal irregularity, contrast extravasation or aneurysm. VENOUS SINUSES: Major dural venous sinuses are patent though not tailored for evaluation on this angiographic examination. ANATOMIC VARIANTS: None. DELAYED PHASE: No abnormal intracranial enhancement. IMPRESSION: CT HEAD: Acute LEFT greater than RIGHT posterior circulation infarcts, no hemorrhagic conversion. Old LEFT cerebellum and LEFT basal ganglia infarcts. CTA NECK: Atherosclerosis without hemodynamically significant stenosis or acute vascular process. Mediastinal lymphadenopathy better characterized on PET- CT May 28, 2016 CTA HEAD:  No emergent large vessel or occlusion or severe stenosis. Moderate intracranial atherosclerosis and scattered stenoses. Electronically Signed   By: Elon Alas M.D.   On: 07/03/2016 00:28   Dg Chest 2 View  Result Date: 07/03/2016 CLINICAL DATA:  Evaluate for cough after recent stroke. EXAM: CHEST  2 VIEW COMPARISON:  02/20/2015 FINDINGS: There is chronic elevation of the right diaphragm. There is no focal parenchymal opacity. There is no pleural effusion or pneumothorax. The heart and mediastinal contours are unremarkable. There are surgical clips in the right axilla from prior axillary dissection. The osseous structures are unremarkable. IMPRESSION: No  active cardiopulmonary disease. Electronically Signed   By: Kathreen Devoid   On: 07/03/2016 11:14   Ct Angio Neck W And/or Wo Contrast  Result Date: 07/03/2016 CLINICAL DATA:  Nausea for several weeks. History of metastatic melanoma, hypertension. Follow-up acute posterior circulation infarcts. EXAM: CT ANGIOGRAPHY HEAD AND NECK TECHNIQUE: Multidetector CT imaging of the head and neck was performed using the standard protocol during bolus administration of intravenous contrast. Multiplanar CT image reconstructions and MIPs were obtained to evaluate the vascular anatomy. Carotid stenosis measurements (when applicable) are obtained utilizing NASCET criteria, using the distal internal carotid diameter as the denominator. CONTRAST:  80 cc Isovue 370 COMPARISON:  MRI of the head July 02, 2016 and PET CT May 28, 2016 FINDINGS: CT HEAD FINDINGS BRAIN: Wedge-like hypodensity LEFT temporal and occipital lobes corresponding to acute infarct. Patchy hypodensities in the LEFT inferior cerebellum. Focal hypodensity cyst RIGHT sagittal splenium of the corpus callosum. Ventricles and sulci are normal for patient's age. No intraparenchymal hemorrhage, mass effect. Old LEFT basal ganglia lacunar infarct. Ventricles and sulci are normal for patient's age. No abnormal extra-axial fluid collections. VASCULAR: Mild calcific atherosclerosis of the carotid siphons. SKULL: No skull fracture. No significant scalp soft tissue swelling. SINUSES/ORBITS: The mastoid air-cells and included paranasal sinuses are well-aerated. Status post bilateral ocular lens implants. The included ocular globes and orbital contents are non-suspicious. OTHER: None. CTA NECK AORTIC ARCH: Mild calcific atherosclerosis of the aortic arch, 2 vessel arch is a normal variant. Mild stenosis LEFT subclavian artery origin due to eccentric calcific atherosclerosis. Mild stenosis proximal RIGHT subclavian artery. Origins of the innominate, left Common carotid  artery and subclavian artery are widely patent. RIGHT CAROTID SYSTEM: Common carotid artery is widely patent, coursing in a straight line fashion. Mild eccentric calcific atherosclerosis of the carotid bifurcation without hemodynamically significant  stenosis by NASCET criteria. Normal appearance of the included internal carotid artery. LEFT CAROTID SYSTEM: Common carotid artery is widely patent, coursing in a straight line fashion. Mild eccentric calcific atherosclerosis of the carotid bifurcation without hemodynamically significant stenosis by NASCET criteria. Normal appearance of the included internal carotid artery. VERTEBRAL ARTERIES:RIGHT vertebral artery is dominant. Normal appearance of the vertebral arteries, which appear widely patent. SKELETON: No acute osseous process though bone windows have not been submitted. Osteopenia. Moderate degenerative changes cervical spine without destructive bony lesions. Patient is edentulous. OTHER NECK: Re- demonstration of LEFT LEFT aortopulmonary window pathologic lymphadenopathy at 3.2 cm. Heterogeneous thyroid gland without dominant nodule. CTA HEAD ANTERIOR CIRCULATION: Normal appearance of the cervical internal carotid arteries, petrous, cavernous and supra clinoid internal carotid arteries. Widely patent anterior communicating artery. Moderate stenosis RIGHT A1 origin. Patent anterior communicating artery. Patent anterior cerebral artery does mild luminal irregularity. Tandem mild to moderate stenoses bilateral mid and distal segments. No large vessel occlusion, hemodynamically significant stenosis, dissection, luminal irregularity, contrast extravasation or aneurysm. POSTERIOR CIRCULATION: Patent vertebral arteries, vertebrobasilar junction and basilar artery, as well as main branch vessels. Mild stenosis mid basilar artery. Patent posterior cerebral arteries. Moderate tandem stenoses bilateral posterior cerebral arteries. No large vessel occlusion, hemodynamically  significant stenosis, dissection, luminal irregularity, contrast extravasation or aneurysm. VENOUS SINUSES: Major dural venous sinuses are patent though not tailored for evaluation on this angiographic examination. ANATOMIC VARIANTS: None. DELAYED PHASE: No abnormal intracranial enhancement. IMPRESSION: CT HEAD: Acute LEFT greater than RIGHT posterior circulation infarcts, no hemorrhagic conversion. Old LEFT cerebellum and LEFT basal ganglia infarcts. CTA NECK: Atherosclerosis without hemodynamically significant stenosis or acute vascular process. Mediastinal lymphadenopathy better characterized on PET- CT May 28, 2016 CTA HEAD:  No emergent large vessel or occlusion or severe stenosis. Moderate intracranial atherosclerosis and scattered stenoses. Electronically Signed   By: Elon Alas M.D.   On: 07/03/2016 00:28   Mr Jeri Cos EL Contrast  Result Date: 07/02/2016 CLINICAL DATA:  Melanoma.  Evaluate for metastases.  Nausea. EXAM: MRI HEAD WITHOUT AND WITH CONTRAST TECHNIQUE: Multiplanar, multiecho pulse sequences of the brain and surrounding structures were obtained without and with intravenous contrast. CONTRAST:  95m MULTIHANCE GADOBENATE DIMEGLUMINE 529 MG/ML IV SOLN COMPARISON:  None. FINDINGS: Brain: Patchy restricted diffusion throughout the bilateral occipital lobes, right posterior corpus callosum, left superior cerebellum/upper vermis, and right posterior pons. The most confluent area of infarction is a moderate area in the left occipital pole measuring up to 35 mm. Patchy superficial enhancement in the occipital lobes attributed to subacute infarct. When accounting for probable ischemic enhancement, no brain metastasis is suspected. No hemorrhage or hydrocephalus. Small remote inferior left cerebellar infarct. Vascular: Preserved major vessel flow voids. Small developmental venous anomaly in the left cerebellum. Skull and upper cervical spine: No marrow lesion noted. Sinuses/Orbits: Negative  Other: The patient's daughter is being made aware of the stroke findings by peforming technologist, [facility] I have communicated with the [place] who is expecting her arrival. IMPRESSION: 1. Numerous acute and subacute infarcts in the posterior circulation as described above. This outpatient is being referred to the ER. 2. Bilateral occipital enhancement is attributed to subacute infarct. No suspected brain metastasis, but suggest follow-up in 2-3 months at which time ischemic enhancement should be resolved. Electronically Signed   By: JMonte FantasiaM.D.   On: 07/02/2016 20:24   [place]  Result Date: 06/11/2016 INDICATION: 76year old female with multifocal hypermetabolic lesions  concerning for metastatic disease. She has a history of multiple prior primary malignancies including melanoma, right lung cancer and a remote history of breast cancer. Ultrasound-guided biopsy of 1 of her liver lesions is necessary to confirm tissue diagnosis. EXAM: ULTRASOUND BIOPSY CORE LIVER MEDICATIONS: None. ANESTHESIA/SEDATION: Moderate (conscious) sedation was employed during this procedure. A total of Versed 2 mg and Fentanyl 100 mcg was administered intravenously. Moderate Sedation Time: 20 minutes. The patient's level of consciousness and vital signs were monitored continuously by radiology nursing throughout the procedure under my direct supervision. FLUOROSCOPY TIME:  Fluoroscopy Time: 0 minutes 0 seconds (0 mGy). COMPLICATIONS: None immediate. PROCEDURE: Informed written consent was obtained from the patient after a thorough discussion of the procedural risks, benefits and alternatives. All questions were addressed. A timeout was performed prior to the initiation of the procedure. The epigastric region was scanned with ultrasound. A hypoechoic lesion in hepatic segment 3 was successfully identified. This corresponds with 1 of the hypermetabolic lesions on  the recent PET-CT. The overlying skin was prepped and draped in standard fashion using chlorhexidine skin prep. Local anesthesia was attained by infiltration with 1% lidocaine. A small dermatotomy was made. Under real-time sonographic guidance, a 17 gauge trocar needle was advanced through the liver capsule and positioned at the margin of the lesion. Multiple 18 gauge core biopsies were then coaxially obtained using the bio Pince automated biopsy device. Biopsy specimens were placed in formalin and delivered to pathology for further analysis. As the trocar needle was removed the biopsy tract was embolized with a Gel-Foam slurry. The patient tolerated the procedure well. There was no evidence of hemorrhage or complication on post biopsy ultrasound imaging. IMPRESSION: Technically successful ultrasound-guided core biopsy of lesion in segment 3 of the liver. Electronically Signed   By: Jacqulynn Cadet M.D.   On: 06/11/2016 17:11   Subjective: She is feeling well, denies vomiting.  She is willing to go home.   Discharge Exam: Vitals:   07/03/16 0939 07/03/16 1139  BP: 139/65 140/80  Pulse: 85 94  Resp:    Temp: 97.5 F (36.4 C) 97.3 F (36.3 C)   Vitals:   07/03/16 0339 07/03/16 0739 07/03/16 0939 07/03/16 1139  BP: (!) 144/77 115/86 139/65 140/80  Pulse: 88 96 85 94  Resp: 18     Temp: 97.5 F (36.4 C) 97.5 F (36.4 C) 97.5 F (36.4 C) 97.3 F (36.3 C)  TempSrc: Oral Oral Oral Oral  SpO2: 98% 98% 96% 97%  Weight:      Height:        General: Pt is alert, awake, not in acute distress Cardiovascular: RRR, S1/S2 +, no rubs, no gallops Respiratory: CTA bilaterally, no wheezing, no rhonchi Abdominal: Soft, NT, ND, bowel sounds + Extremities: no edema, no cyanosis    The results of significant diagnostics from this hospitalization (including imaging, microbiology, ancillary and laboratory) are listed below for reference.     Microbiology: No results found for this or any previous  visit (from the past 240 hour(s)).   Labs: BNP (last 3 results) No results for input(s): BNP in the last 8760 hours. Basic Metabolic Panel:  Recent Labs Lab 06/29/16 0846 07/02/16 2120 07/03/16 0844  NA 142 142 141  K 3.4* 3.4* 3.6  CL  --  103 103  CO2 32* 28 26  GLUCOSE 137 123* 128*  BUN 10.'8 17 11  '$ CREATININE 0.6 0.73 0.55  CALCIUM 9.4 9.2 9.3   Liver Function Tests:  Recent Labs Lab  06/29/16 0846 07/02/16 2120  AST 25 33  ALT 12 22  ALKPHOS 113 104  BILITOT 0.73 0.8  PROT 6.0* 6.1*  ALBUMIN 2.6* 3.0*   No results for input(s): LIPASE, AMYLASE in the last 168 hours. No results for input(s): AMMONIA in the last 168 hours. CBC:  Recent Labs Lab 06/29/16 0846 07/02/16 2120 07/03/16 0844  WBC 12.3* 17.0* 14.2*  NEUTROABS 9.9* 14.2*  --   HGB 14.1 14.6 14.7  HCT 42.9 44.6 45.6  MCV 81.6 80.9 83.2  PLT 201 135* 142*   Cardiac Enzymes: No results for input(s): CKTOTAL, CKMB, CKMBINDEX, TROPONINI in the last 168 hours. BNP: Invalid input(s): POCBNP CBG: No results for input(s): GLUCAP in the last 168 hours. D-Dimer No results for input(s): DDIMER in the last 72 hours. Hgb A1c No results for input(s): HGBA1C in the last 72 hours. Lipid Profile  Recent Labs  07/03/16 0431  CHOL 189  HDL 43  LDLCALC 92  TRIG 270*  CHOLHDL 4.4   Thyroid function studies No results for input(s): TSH, T4TOTAL, T3FREE, THYROIDAB in the last 72 hours.  Invalid input(s): FREET3 Anemia work up No results for input(s): VITAMINB12, FOLATE, FERRITIN, TIBC, IRON, RETICCTPCT in the last 72 hours. Urinalysis    Component Value Date/Time   COLORURINE YELLOW 09/23/2012 1739   APPEARANCEUR HAZY 09/23/2012 1739   LABSPEC >=1.030 09/23/2012 1739   PHURINE 6.0 09/23/2012 1739   GLUCOSEU NEGATIVE 09/23/2012 1739   HGBUR 3+ 09/23/2012 1739   BILIRUBINUR NEGATIVE 09/23/2012 1739   KETONESUR NEGATIVE 09/23/2012 1739   PROTEINUR TRACE 09/23/2012 1739   NITRITE NEGATIVE  09/23/2012 1739   LEUKOCYTESUR TRACE 09/23/2012 1739   Sepsis Labs Invalid input(s): PROCALCITONIN,  WBC,  LACTICIDVEN Microbiology No results found for this or any previous visit (from the past 240 hour(s)).   Time coordinating discharge: Over 30 minutes  SIGNED:   Elmarie Shiley, MD  [facility] 07/03/2016, 3:52 PM Pager   If 7PM-7AM, please contact night-coverage [email or web address] Password [email or web address]

## 2016-07-03 NOTE — Evaluation (Signed)
Physical Therapy Evaluation Patient Details Name: Victoria Castro MRN: [id number] DOB: January 17, 1941 Today's Date: 07/03/2016   History of Present Illness  Victoria Castro is a 76 y.o. 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   Clinical Impression  Pt presenting with generalized weakness and impaired balance with mild bilat LE ataxia. Pt to strongly benefit from HHPT to address mentioned deficits. Pt with good support and home set up.    Follow Up Recommendations Home health PT;Supervision/Assistance - 24 hour    Equipment Recommendations  None recommended by PT    Recommendations for Other Services       Precautions / Restrictions Precautions Precautions: Fall Precaution Comments: difficulty speaking from precous surgeries Restrictions Weight Bearing Restrictions: No      Mobility  Bed Mobility               General bed mobility comments: pt up in chair upon PT arrival  Transfers Overall transfer level: Needs assistance Equipment used: 1 person hand held assist Transfers: Sit to/from Stand Sit to Stand: Min guard;Min assist         General transfer comment: pt pushed up from chair, but then reached for someone to hold onto  Ambulation/Gait Ambulation/Gait assistance: Min assist Ambulation Distance (Feet): 120 Feet Assistive device: Rolling walker (2 wheeled) Gait Pattern/deviations: Step-through pattern;Decreased stride length;Wide base of support Gait velocity: slow Gait velocity interpretation: Below normal speed for age/gender General Gait Details: attempted to amb with just HHA however pt unsteady with mild ataxia in bilat LE. pt reached for RW. pt required minA for walker management due to pushing it to far in front and stradling walker during turns  Stairs Stairs: Yes Stairs assistance: Min assist Stair Management: One rail  Right;Step to pattern Number of Stairs: 3 General stair comments: mimic home set up  Wheelchair Mobility    Modified Rankin (Stroke Patients Only) Modified Rankin (Stroke Patients Only) Pre-Morbid Rankin Score: Slight disability Modified Rankin: Moderate disability     Balance Overall balance assessment: Needs assistance Sitting-balance support: Feet supported;No upper extremity supported Sitting balance-Leahy Scale: Fair     Standing balance support: No upper extremity supported Standing balance-Leahy Scale: Poor Standing balance comment: pt reaching for support                             Pertinent Vitals/Pain Pain Assessment: No/denies pain    Home Living Family/patient expects to be discharged to:: Private residence Living Arrangements: Children (daughter who is a Marine scientist) Available Help at Discharge: Family;Available PRN/intermittently Type of Home: House Home Access: Stairs to enter Entrance Stairs-Rails: Can reach both Entrance Stairs-Number of Steps: 3 Home Layout: One level Home Equipment: Walker - 4 wheels;Cane - single point;Shower seat Additional Comments: pt was living alone up until 2 weeks ago    Prior Function Level of Independence: Needs assistance   Gait / Transfers Assistance Needed: was amb indep until 1 week ago started using rollator  ADL's / Homemaking Assistance Needed: family present during shower but doesn't assist        Hand Dominance   Dominant Hand: Right    Extremity/Trunk Assessment   Upper Extremity Assessment Upper Extremity Assessment: Generalized weakness    Lower Extremity Assessment Lower Extremity Assessment: Generalized weakness    Cervical / Trunk Assessment Cervical / Trunk Assessment: Normal  Communication   Communication: Expressive  difficulties  Cognition Arousal/Alertness: Awake/alert Behavior During Therapy: WFL for tasks assessed/performed Overall Cognitive Status: Within Functional Limits  for tasks assessed                      General Comments General comments (skin integrity, edema, etc.): pt with mild LE edema    Exercises     Assessment/Plan    PT Assessment Patient needs continued PT services  PT Problem List Decreased strength;Decreased activity tolerance;Decreased balance;Decreased mobility;Decreased coordination;Decreased cognition;Decreased knowledge of use of DME;Decreased safety awareness          PT Treatment Interventions DME instruction;Gait training;Stair training;Therapeutic activities;Functional mobility training;Therapeutic exercise;Balance training    PT Goals (Current goals can be found in the Care Plan section)  Acute Rehab PT Goals Patient Stated Goal: home today PT Goal Formulation: With patient Time For Goal Achievement: 07/10/16 Potential to Achieve Goals: Good Additional Goals Additional Goal #1: Pt to score >19 on DGI to indicate minimal falls risk.    Frequency Min 3X/week   Barriers to discharge        Co-evaluation               End of Session Equipment Utilized During Treatment: Gait belt Activity Tolerance: Patient tolerated treatment well Patient left: in chair;with call bell/phone within reach;with chair alarm set;with family/visitor present Nurse Communication: Mobility status         Time: 3295-1884 PT Time Calculation (min) (ACUTE ONLY): 14 min   Charges:   PT Evaluation $PT Eval Low Complexity: 1 Procedure     PT G Codes:        Gabby Rackers M Akyia Borelli 07/03/2016, 8:09 AM   Kittie Plater, PT, DPT Pager #: [phone] Office #: [phone]

## 2016-07-03 NOTE — Progress Notes (Signed)
Initial Nutrition Assessment  DOCUMENTATION CODES:   Severe malnutrition in context of chronic illness, Underweight  INTERVENTION:  Provide Snacks TID Magic cup BID each supplement provides 290 kcal and 9 grams of protein   NUTRITION DIAGNOSIS:   Inadequate oral intake related to cancer and cancer related treatments, nausea as evidenced by energy intake < or equal to 75% for > or equal to 1 month, percent weight loss.   GOAL:   Patient will meet greater than or equal to 90% of their needs   MONITOR:   PO intake, Weight trends, Skin, I & O's, Labs  REASON FOR ASSESSMENT:   Malnutrition Screening Tool    ASSESSMENT:   76 y.o. female with a past medical history significant for metastatic melanoma, hypotyhroidism, HTN who presents with abnormal MRI. She has been suffering for weeks from nausea and minimal PO intake requiring IV fluids repeatedly, not responsive to ondansetron, phenergan, or PPI. MRI shows multiple bilateral small acute infarcts.  Pt's daughter at bedside assisted patient in providing history. Pt finished treatment for cancer in October and has had no appetite and nausea since. Pt reports eating a few bites of foodor a quarter of a sandwich at each meal and estimates eating about 200 kcal daily. She was drinking Ensure once daily for a while, but got tired of it. Pt had only eaten a few bites of ice cream from lunch tray; she stated she was full because she ate breakfast later. 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 Tech arrived during visit to do bedside echo. RD offered snacks and supplements. Pt agreeable to snacks and trying Magic Cup ice cream. Will need follow-up at later time for nutrition-focused physical exam.  Weight history shows that patient lost 17% of her body weight since 02/25/16- wt loss severe for time frame. Discussed supplement options and high calorie beverage options with pt's  daughter.   Labs: elevated triglycerides  Diet Order:  Diet Heart Room service appropriate? Yes; Fluid consistency: Thin  Skin:  Reviewed, no issues  Last BM:  PTA  Height:   Ht Readings from Last 1 Encounters:  07/02/16 '5\' 3"'$  (1.6 m)    Weight:   Wt Readings from Last 1 Encounters:  07/02/16 103 lb (46.7 kg)    Ideal Body Weight:  52.27 kg  BMI:  Body mass index is 18.25 kg/m.  Estimated Nutritional Needs:   Kcal:  1400-1600  Protein:  65-75 grams  Fluid:  1.4 L/day  EDUCATION NEEDS:   No education needs identified at this time  Scarlette Ar RD, LDN, CSP Inpatient Clinical Dietitian Pager: [phone] After Hours Pager: [phone]

## 2016-07-03 NOTE — Care Management Note (Signed)
Case Management Note  Patient Details  Name: Victoria Castro MRN: [id number] Date of Birth: [birth year]  Subjective/Objective:       Patient presented with CVA. Lives at home alone, but recently began staying with her daughter. Ambulates independently at baseline. CM will follow for discharge needs pending PT/OT evals and physician orders.              Action/Plan:   Expected Discharge Date:  07/04/16               Expected Discharge Plan:     In-House Referral:     Discharge planning Services     Post Acute Care Choice:    Choice offered to:     DME Arranged:    DME Agency:     HH Arranged:    HH Agency:     Status of Service:     If discussed at [employer], dates discussed:    Additional Comments:  Rolm Baptise, RN 07/03/2016, 9:46 AM

## 2016-07-03 NOTE — Progress Notes (Signed)
Pt discharged home with daughter. Discharge instructions were reviewed with the patient & daughter. Patient & daughter verbalized understanding.

## 2016-07-03 NOTE — ED Notes (Signed)
Spoke with carelink report given and care hand off done Spoke with dr. Rennis Petty wanted updates told pt on the way to [place].

## 2016-07-03 NOTE — Discharge Instructions (Signed)
Do not take BP medications if your Systolic Blood pressure is less than 130

## 2016-07-03 NOTE — Progress Notes (Signed)
OT Cancellation Note  Patient Details Name: LEINANI LISBON MRN: [id number] DOB: 07/13/1940   Cancelled Treatment:    Reason Eval/Treat Not Completed: Patient at procedure or test/ unavailable  Vonita Moss   OTR/L Pager: [phone] Office: [phone] .  07/03/2016, 2:11 PM

## 2016-07-03 NOTE — Progress Notes (Signed)
**  Preliminary report by tech**  Bilateral lower extremity venous duplex completed. There is no evidence of deep or superficial vein thrombosis involving the right and left lower extremities. All visualized vessels appear patent and compressible. There is no evidence of Baker's cysts bilaterally. Results given to the patient's nurse, Laverda Sorenson.  07/03/16 2:29 PM Carlos Levering RVT

## 2016-07-04 LAB — HEMOGLOBIN A1C
Hgb A1c MFr Bld: 5 % (ref 4.8–5.6)
Mean Plasma Glucose: 97 mg/dL

## 2016-07-06 ENCOUNTER — Telehealth: Payer: Self-pay | Admitting: Oncology

## 2016-07-06 ENCOUNTER — Other Ambulatory Visit: Payer: Self-pay | Admitting: Oncology

## 2016-07-06 ENCOUNTER — Ambulatory Visit (HOSPITAL_BASED_OUTPATIENT_CLINIC_OR_DEPARTMENT_OTHER): Payer: Medicare Other

## 2016-07-06 ENCOUNTER — Other Ambulatory Visit: Payer: Self-pay | Admitting: *Deleted

## 2016-07-06 ENCOUNTER — Telehealth: Payer: Self-pay | Admitting: *Deleted

## 2016-07-06 ENCOUNTER — Other Ambulatory Visit: Payer: Medicare Other

## 2016-07-06 ENCOUNTER — Ambulatory Visit (HOSPITAL_BASED_OUTPATIENT_CLINIC_OR_DEPARTMENT_OTHER): Payer: Medicare Other | Admitting: Oncology

## 2016-07-06 VITALS — BP 130/58 | HR 105 | Temp 97.5°F | Resp 18 | Ht 63.0 in | Wt 98.7 lb

## 2016-07-06 DIAGNOSIS — C787 Secondary malignant neoplasm of liver and intrahepatic bile duct: Secondary | ICD-10-CM | POA: Diagnosis not present

## 2016-07-06 DIAGNOSIS — C799 Secondary malignant neoplasm of unspecified site: Secondary | ICD-10-CM

## 2016-07-06 DIAGNOSIS — R110 Nausea: Secondary | ICD-10-CM

## 2016-07-06 DIAGNOSIS — R634 Abnormal weight loss: Secondary | ICD-10-CM

## 2016-07-06 DIAGNOSIS — Z7901 Long term (current) use of anticoagulants: Secondary | ICD-10-CM

## 2016-07-06 DIAGNOSIS — C7800 Secondary malignant neoplasm of unspecified lung: Secondary | ICD-10-CM

## 2016-07-06 DIAGNOSIS — R112 Nausea with vomiting, unspecified: Secondary | ICD-10-CM | POA: Diagnosis not present

## 2016-07-06 DIAGNOSIS — C430 Malignant melanoma of lip: Secondary | ICD-10-CM

## 2016-07-06 DIAGNOSIS — C3401 Malignant neoplasm of right main bronchus: Secondary | ICD-10-CM

## 2016-07-06 DIAGNOSIS — C770 Secondary and unspecified malignant neoplasm of lymph nodes of head, face and neck: Secondary | ICD-10-CM | POA: Diagnosis not present

## 2016-07-06 DIAGNOSIS — E876 Hypokalemia: Secondary | ICD-10-CM

## 2016-07-06 DIAGNOSIS — C439 Malignant melanoma of skin, unspecified: Secondary | ICD-10-CM

## 2016-07-06 DIAGNOSIS — I6340 Cerebral infarction due to embolism of unspecified cerebral artery: Secondary | ICD-10-CM

## 2016-07-06 DIAGNOSIS — E46 Unspecified protein-calorie malnutrition: Secondary | ICD-10-CM

## 2016-07-06 MED ORDER — SODIUM CHLORIDE 0.9 % IV SOLN: Freq: Once | INTRAVENOUS | Status: DC

## 2016-07-06 MED ORDER — SODIUM CHLORIDE 0.9 % IV SOLN
Freq: Once | INTRAVENOUS | Status: DC
  Administered 2016-07-06: 12:00:00 via INTRAVENOUS

## 2016-07-06 MED ORDER — DEXAMETHASONE 2 MG PO TABS: 2.0000 mg | ORAL_TABLET | Freq: Every day | ORAL | 0 refills | Status: DC

## 2016-07-06 NOTE — Patient Instructions (Signed)
Dehydration, Adult Dehydration is a condition in which there is not enough fluid or water in the body. This happens when you lose more fluids than you take in. Important organs, such as the kidneys, brain, and heart, cannot function without a proper amount of fluids. Any loss of fluids from the body can lead to dehydration. Dehydration can range from mild to severe. This condition should be treated right away to prevent it from becoming severe. What are the causes? This condition may be caused by:  Vomiting.  Diarrhea.  Excessive sweating, such as from heat exposure or exercise.  Not drinking enough fluid, especially:  When ill.  While doing activity that requires a lot of energy.  Excessive urination.  Fever.  Infection.  Certain medicines, such as medicines that cause the body to lose excess fluid (diuretics).  Inability to access safe drinking water.  Reduced physical ability to get adequate water and food. What increases the risk? This condition is more likely to develop in people:  Who have a poorly controlled long-term (chronic) illness, such as diabetes, heart disease, or kidney disease.  Who are age 65 or older.  Who are disabled.  Who live in a place with high altitude.  Who play endurance sports. What are the signs or symptoms? Symptoms of mild dehydration may include:   Thirst.  Dry lips.  Slightly dry mouth.  Dry, warm skin.  Dizziness. Symptoms of moderate dehydration may include:   Very dry mouth.  Muscle cramps.  Dark urine. Urine may be the color of tea.  Decreased urine production.  Decreased tear production.  Heartbeat that is irregular or faster than normal (palpitations).  Headache.  Light-headedness, especially when you stand up from a sitting position.  Fainting (syncope). Symptoms of severe dehydration may include:   Changes in skin, such as:  Cold and clammy skin.  Blotchy (mottled) or pale skin.  Skin that does  not quickly return to normal after being lightly pinched and released (poor skin turgor).  Changes in body fluids, such as:  Extreme thirst.  No tear production.  Inability to sweat when body temperature is high, such as in hot weather.  Very little urine production.  Changes in vital signs, such as:  Weak pulse.  Pulse that is more than 100 beats a minute when sitting still.  Rapid breathing.  Low blood pressure.  Other changes, such as:  Sunken eyes.  Cold hands and feet.  Confusion.  Lack of energy (lethargy).  Difficulty waking up from sleep.  Short-term weight loss.  Unconsciousness. How is this diagnosed? This condition is diagnosed based on your symptoms and a physical exam. Blood and urine tests may be done to help confirm the diagnosis. How is this treated? Treatment for this condition depends on the severity. Mild or moderate dehydration can often be treated at home. Treatment should be started right away. Do not wait until dehydration becomes severe. Severe dehydration is an emergency and it needs to be treated in a hospital. Treatment for mild dehydration may include:   Drinking more fluids.  Replacing salts and minerals in your blood (electrolytes) that you may have lost. Treatment for moderate dehydration may include:   Drinking an oral rehydration solution (ORS). This is a drink that helps you replace fluids and electrolytes (rehydrate). It can be found at pharmacies and retail stores. Treatment for severe dehydration may include:   Receiving fluids through an IV tube.  Receiving an electrolyte solution through a feeding tube that is   passed through your nose and into your stomach (nasogastric tube, or NG tube).  Correcting any abnormalities in electrolytes.  Treating the underlying cause of dehydration. Follow these instructions at home:  If directed by your health care provider, drink an ORS:  Make an ORS by following instructions on the  package.  Start by drinking small amounts, about  cup (120 mL) every 5-10 minutes.  Slowly increase how much you drink until you have taken the amount recommended by your health care provider.  Drink enough clear fluid to keep your urine clear or pale yellow. If you were told to drink an ORS, finish the ORS first, then start slowly drinking other clear fluids. Drink fluids such as:  Water. Do not drink only water. Doing that can lead to having too little salt (sodium) in the body (hyponatremia).  Ice chips.  Fruit juice that you have added water to (diluted fruit juice).  Low-calorie sports drinks.  Avoid:  Alcohol.  Drinks that contain a lot of sugar. These include high-calorie sports drinks, fruit juice that is not diluted, and soda.  Caffeine.  Foods that are greasy or contain a lot of fat or sugar.  Take over-the-counter and prescription medicines only as told by your health care provider.  Do not take sodium tablets. This can lead to having too much sodium in the body (hypernatremia).  Eat foods that contain a healthy balance of electrolytes, such as bananas, oranges, potatoes, tomatoes, and spinach.  Keep all follow-up visits as told by your health care provider. This is important. Contact a health care provider if:  You have abdominal pain that:  Gets worse.  Stays in one area (localizes).  You have a rash.  You have a stiff neck.  You are more irritable than usual.  You are sleepier or more difficult to wake up than usual.  You feel weak or dizzy.  You feel very thirsty.  You have urinated only a small amount of very dark urine over 6-8 hours. Get help right away if:  You have symptoms of severe dehydration.  You cannot drink fluids without vomiting.  Your symptoms get worse with treatment.  You have a fever.  You have a severe headache.  You have vomiting or diarrhea that:  Gets worse.  Does not go away.  You have blood or green matter  (bile) in your vomit.  You have blood in your stool. This may cause stool to look black and tarry.  You have not urinated in 6-8 hours.  You faint.  Your heart rate while sitting still is over 100 beats a minute.  You have trouble breathing. This information is not intended to replace advice given to you by your health care provider. Make sure you discuss any questions you have with your health care provider. Document Released: 05/04/2005 Document Revised: 11/29/2015 Document Reviewed: 06/28/2015 Elsevier Interactive Patient Education  2017 [employer]  

## 2016-07-06 NOTE — Telephone Encounter (Signed)
Message sent to infusion scheduler to be added. Appointments scheduled per, 07/06/16 los. Patient was given a copy of the AVS report and appointment schedule per, 07/06/16 los.

## 2016-07-06 NOTE — Progress Notes (Signed)
Hematology and Oncology Follow Up Visit  Victoria Castro [id number] February 25, 1941 76 y.o. 07/06/2016 10:27 AM Victoria Castro   Principle Diagnosis: 76 year old with superficial spreading melanoma diagnosed in February 2017. She developed subsequently cervical adenopathy in June 2017. Her final pathological staging was IIIB ([id number]).  She was found to have metastatic melanoma in January 2018. The BRAF testing is pending.   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 She underwent Mohs procedure in February 2017 which did not show any residual malignancy at that time.  She subsequently started developing a tender mass in her right submandibular region which did not improve with antibiotics. Evaluate her aspiration done on 09/16/2015 showed atypical cells suspicious for malignancy.  PET scan showed right-sided lymphadenopathy at the level of IIa. She subsequently underwent a repeat biopsy on 10/02/2015 which confirmed the presence of metastatic melanoma. She is status post neck dissection performed on 10/22/2015 which showed 2 out of 7 lymph nodes involved with metastatic melanoma. Adjuvant ipilimumab at reduced dose of 3 mg/kg every 3 weeks for a total of 4 doses. She is supposed 4 treatments completed in 03/17/2016. She is status post liver biopsy on 06/11/2016. The biopsy confirmed the presence of metastatic melanoma with BRAF testing still pending.  Current therapy: Supportive management.  Interim History: Victoria Castro presents today for a follow-up visit. Since the last visit, she was started on dexamethasone 4 mg twice a day and did well temporarily with increase in her appetite but developed generalized anasarca. She underwent an MRI of the brain on 07/02/2016 for staging purposes and was found to have numerous acute and subacute infarcts consistent with embolic strokes. No  metastasis noted at that time. She was hospitalized briefly and was started on Lovenox for anticoagulation purposes. Dexamethasone was discontinued.  Clinically, she continues to decline and her appetite remains poor. She lost more weight and continues to struggle with eating. Her performance status has been limited. She is currently residing with her daughter who is helping administer Lovenox therapy. Home care is involved in her care at this time.  She does not report any neurological symptoms. She denied any syncope or seizures. She denied any fevers or chills or sweats. She does not report any wheezing or hemoptysis. She does not report any chest pain, palpitation, orthopnea or leg edema. She does not report any frequency urgency or hesitancy. She does not report any skeletal complaints. Remaining review of systems unremarkable.   Medications: I have reviewed the patient's current medications.  Current Outpatient Prescriptions  Medication Sig Dispense Refill  . amLODipine-benazepril (LOTREL) 5-20 MG capsule Take 1 capsule by mouth daily. Hold for SBP less than 130 10 capsule 0  . enoxaparin (LOVENOX) 40 MG/0.4ML injection Inject 0.4 mLs (40 mg total) into the skin every 12 (twelve) hours. 60 Syringe 0  . levothyroxine (SYNTHROID, LEVOTHROID) 50 MCG tablet Take 50 mcg by mouth daily before breakfast.    . mirtazapine (REMERON) 15 MG tablet Take 1 tablet (15 mg total) by mouth at bedtime. 30 tablet 1  . ondansetron (ZOFRAN ODT) 8 MG disintegrating tablet Take 1 tablet (8 mg total) by mouth every 8 (eight) hours as needed for nausea or vomiting. 30 tablet 1  . pantoprazole (PROTONIX) 40 MG tablet Take 1 tablet (40 mg total) by mouth 2 (two) times daily. 60 tablet 2  . prochlorperazine (COMPAZINE) 10 MG  tablet Take 1 tablet (10 mg total) by mouth every 6 (six) hours as needed for nausea or vomiting. 30 tablet 1  . promethazine (PHENERGAN) 25 MG tablet Take 25 mg by mouth every 6 (six) hours as needed  for nausea or vomiting.    Marland Kitchen dexamethasone (DECADRON) 2 MG tablet Take 1 tablet (2 mg total) by mouth daily. 30 tablet 0   No current facility-administered medications for this visit.      Allergies: No Known Allergies  Past Medical History, Surgical history, Social history, and Family History were reviewed and updated.   Physical Exam: Blood pressure (!) 130/58, pulse (!) 105, temperature 97.5 F (36.4 C), temperature source Oral, resp. rate 18, height 5' 3"  (1.6 m), weight 98 lb 11.2 oz (44.8 kg), SpO2 98 %. ECOG: 1 General appearance: Alert, awake woman appears chronically ill. Head: Normocephalic, without obvious abnormality no oral thrush noted. Neck: no adenopathy Lymph nodes: Cervical, supraclavicular, and axillary nodes normal. Heart:regular rate and rhythm, S1, S2 normal, no murmur, click, rub or gallop Lung:chest clear, no wheezing, rales, normal symmetric air entry.  Abdomin: soft, non-tender, without masses or organomegaly no rebound or guarding. EXT:no erythema, induration, or nodules no edema noted.   Lab Results: Lab Results  Component Value Date   WBC 14.2 (H) 07/03/2016   HGB 14.7 07/03/2016   HCT 45.6 07/03/2016   MCV 83.2 07/03/2016   PLT 142 (L) 07/03/2016     Chemistry      Component Value Date/Time   NA 141 07/03/2016 0844   NA 138 05/28/2016 1040   K 3.6 07/03/2016 0844   K 3.7 05/28/2016 1040   CL 103 07/03/2016 0844   CL 100 02/21/2014 0902   CO2 26 07/03/2016 0844   CO2 21 (L) 05/28/2016 1040   BUN 11 07/03/2016 0844   BUN 19.3 05/28/2016 1040   CREATININE 0.55 07/03/2016 0844   CREATININE 0.7 05/28/2016 1040      Component Value Date/Time   CALCIUM 9.3 07/03/2016 0844   CALCIUM 10.9 (H) 05/28/2016 1040   ALKPHOS 104 07/02/2016 2120   ALKPHOS 129 05/28/2016 1040   AST 33 07/02/2016 2120   AST 10 05/28/2016 1040   ALT 22 07/02/2016 2120   ALT 9 05/28/2016 1040   BILITOT 0.8 07/02/2016 2120   BILITOT 0.94 05/28/2016 1040       Impression and Plan:  76 year old woman with the following issues:  1. 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 She was initially found to have a lesion on the right upper lip and subsequently developed cervical adenopathy with the final pathological staging as IIIB (TisN2aM0). She is status post Mohs procedure followed by neck dissection completed in June 2017.  She is S/P adjuvant therapy utilizing ipilimumab at 3 mg/kg every 3 weeks. She is status post 4 treatments completed without acute toxicities. She did develop delayed toxicity including hypothyroidism which she is currently being treated for.  PET CT scan obtained on 05/28/2016 showed evidence of metastatic disease in the liver as well as in the lung.   The biopsy from her liver mass confirmed the presence of melanoma but her BRAF testing could not be completed because quantity was not sufficient.  I gave her the option today with repeat tissue biopsy versus obtaining BRAF testing utilizing [employer] platform. She refuses another tissue biopsy and we will attempt to obtain be ref status utilizing liquid biopsy. Definitive treatment for her melanoma will be determined by her BRAF status.  2. Nausea and vomiting with poor by mouth intake and malnutrition: She status post endoscopy completed on 06/26/2016 without any abnormalities.  MRI of the brain obtained on 07/02/2016 did not show any brain metastasis.   I have recommended low-dose dexamethasone at 2 mg daily in attempt to boost her nutritional status.  3. Hypercalcemia: Related to malignancy. She is status post Zometa on January 11 and her calcium corrected on January 25. This was repeated on 07/03/2016 and appeared to be within normal range.  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 Her echocardiogram and Doppler's did not show any thrombosis. She is currently on Lovenox for long-term treatment.  5. Prognosis: She understands that she  is an incurable malignancy with poor prognosis. If her nutritional status does not improve in the near future, he might require hospice sooner rather than later.  6. Follow-up: Will be 1 week to follow her status.   [facility], Castro 2/19/201810:27 AM

## 2016-07-06 NOTE — Telephone Encounter (Signed)
Per 2/19 LOS and staff message I have scheduled appts. Gave calendar to the patient and notified the scheduler.

## 2016-07-08 ENCOUNTER — Ambulatory Visit: Payer: Medicare Other | Admitting: Nurse Practitioner

## 2016-07-08 LAB — CULTURE, BLOOD (SINGLE): Culture: NO GROWTH

## 2016-07-10 ENCOUNTER — Ambulatory Visit (HOSPITAL_BASED_OUTPATIENT_CLINIC_OR_DEPARTMENT_OTHER): Payer: Medicare Other

## 2016-07-10 VITALS — BP 128/82 | HR 92 | Temp 97.6°F | Resp 17

## 2016-07-10 DIAGNOSIS — C770 Secondary and unspecified malignant neoplasm of lymph nodes of head, face and neck: Secondary | ICD-10-CM | POA: Diagnosis not present

## 2016-07-10 DIAGNOSIS — C7800 Secondary malignant neoplasm of unspecified lung: Secondary | ICD-10-CM | POA: Diagnosis not present

## 2016-07-10 DIAGNOSIS — C787 Secondary malignant neoplasm of liver and intrahepatic bile duct: Secondary | ICD-10-CM | POA: Diagnosis not present

## 2016-07-10 DIAGNOSIS — C430 Malignant melanoma of lip: Secondary | ICD-10-CM

## 2016-07-10 DIAGNOSIS — R112 Nausea with vomiting, unspecified: Secondary | ICD-10-CM | POA: Diagnosis not present

## 2016-07-10 DIAGNOSIS — C439 Malignant melanoma of skin, unspecified: Secondary | ICD-10-CM

## 2016-07-10 MED ORDER — SODIUM CHLORIDE 0.9 % IV SOLN
Freq: Once | INTRAVENOUS | Status: AC
  Administered 2016-07-10: 13:00:00 via INTRAVENOUS

## 2016-07-10 NOTE — Patient Instructions (Signed)
Dehydration, Adult Dehydration is a condition in which there is not enough fluid or water in the body. This happens when you lose more fluids than you take in. Important organs, such as the kidneys, brain, and heart, cannot function without a proper amount of fluids. Any loss of fluids from the body can lead to dehydration. Dehydration can range from mild to severe. This condition should be treated right away to prevent it from becoming severe. What are the causes? This condition may be caused by:  Vomiting.  Diarrhea.  Excessive sweating, such as from heat exposure or exercise.  Not drinking enough fluid, especially:  When ill.  While doing activity that requires a lot of energy.  Excessive urination.  Fever.  Infection.  Certain medicines, such as medicines that cause the body to lose excess fluid (diuretics).  Inability to access safe drinking water.  Reduced physical ability to get adequate water and food. What increases the risk? This condition is more likely to develop in people:  Who have a poorly controlled long-term (chronic) illness, such as diabetes, heart disease, or kidney disease.  Who are age 65 or older.  Who are disabled.  Who live in a place with high altitude.  Who play endurance sports. What are the signs or symptoms? Symptoms of mild dehydration may include:   Thirst.  Dry lips.  Slightly dry mouth.  Dry, warm skin.  Dizziness. Symptoms of moderate dehydration may include:   Very dry mouth.  Muscle cramps.  Dark urine. Urine may be the color of tea.  Decreased urine production.  Decreased tear production.  Heartbeat that is irregular or faster than normal (palpitations).  Headache.  Light-headedness, especially when you stand up from a sitting position.  Fainting (syncope). Symptoms of severe dehydration may include:   Changes in skin, such as:  Cold and clammy skin.  Blotchy (mottled) or pale skin.  Skin that does  not quickly return to normal after being lightly pinched and released (poor skin turgor).  Changes in body fluids, such as:  Extreme thirst.  No tear production.  Inability to sweat when body temperature is high, such as in hot weather.  Very little urine production.  Changes in vital signs, such as:  Weak pulse.  Pulse that is more than 100 beats a minute when sitting still.  Rapid breathing.  Low blood pressure.  Other changes, such as:  Sunken eyes.  Cold hands and feet.  Confusion.  Lack of energy (lethargy).  Difficulty waking up from sleep.  Short-term weight loss.  Unconsciousness. How is this diagnosed? This condition is diagnosed based on your symptoms and a physical exam. Blood and urine tests may be done to help confirm the diagnosis. How is this treated? Treatment for this condition depends on the severity. Mild or moderate dehydration can often be treated at home. Treatment should be started right away. Do not wait until dehydration becomes severe. Severe dehydration is an emergency and it needs to be treated in a hospital. Treatment for mild dehydration may include:   Drinking more fluids.  Replacing salts and minerals in your blood (electrolytes) that you may have lost. Treatment for moderate dehydration may include:   Drinking an oral rehydration solution (ORS). This is a drink that helps you replace fluids and electrolytes (rehydrate). It can be found at pharmacies and retail stores. Treatment for severe dehydration may include:   Receiving fluids through an IV tube.  Receiving an electrolyte solution through a feeding tube that is   passed through your nose and into your stomach (nasogastric tube, or NG tube).  Correcting any abnormalities in electrolytes.  Treating the underlying cause of dehydration. Follow these instructions at home:  If directed by your health care provider, drink an ORS:  Make an ORS by following instructions on the  package.  Start by drinking small amounts, about  cup (120 mL) every 5-10 minutes.  Slowly increase how much you drink until you have taken the amount recommended by your health care provider.  Drink enough clear fluid to keep your urine clear or pale yellow. If you were told to drink an ORS, finish the ORS first, then start slowly drinking other clear fluids. Drink fluids such as:  Water. Do not drink only water. Doing that can lead to having too little salt (sodium) in the body (hyponatremia).  Ice chips.  Fruit juice that you have added water to (diluted fruit juice).  Low-calorie sports drinks.  Avoid:  Alcohol.  Drinks that contain a lot of sugar. These include high-calorie sports drinks, fruit juice that is not diluted, and soda.  Caffeine.  Foods that are greasy or contain a lot of fat or sugar.  Take over-the-counter and prescription medicines only as told by your health care provider.  Do not take sodium tablets. This can lead to having too much sodium in the body (hypernatremia).  Eat foods that contain a healthy balance of electrolytes, such as bananas, oranges, potatoes, tomatoes, and spinach.  Keep all follow-up visits as told by your health care provider. This is important. Contact a health care provider if:  You have abdominal pain that:  Gets worse.  Stays in one area (localizes).  You have a rash.  You have a stiff neck.  You are more irritable than usual.  You are sleepier or more difficult to wake up than usual.  You feel weak or dizzy.  You feel very thirsty.  You have urinated only a small amount of very dark urine over 6-8 hours. Get help right away if:  You have symptoms of severe dehydration.  You cannot drink fluids without vomiting.  Your symptoms get worse with treatment.  You have a fever.  You have a severe headache.  You have vomiting or diarrhea that:  Gets worse.  Does not go away.  You have blood or green matter  (bile) in your vomit.  You have blood in your stool. This may cause stool to look black and tarry.  You have not urinated in 6-8 hours.  You faint.  Your heart rate while sitting still is over 100 beats a minute.  You have trouble breathing. This information is not intended to replace advice given to you by your health care provider. Make sure you discuss any questions you have with your health care provider. Document Released: 05/04/2005 Document Revised: 11/29/2015 Document Reviewed: 06/28/2015 Elsevier Interactive Patient Education  2017 [employer]  

## 2016-07-13 ENCOUNTER — Ambulatory Visit (HOSPITAL_BASED_OUTPATIENT_CLINIC_OR_DEPARTMENT_OTHER): Payer: Medicare Other | Admitting: Nurse Practitioner

## 2016-07-13 ENCOUNTER — Other Ambulatory Visit (HOSPITAL_BASED_OUTPATIENT_CLINIC_OR_DEPARTMENT_OTHER): Payer: Medicare Other

## 2016-07-13 ENCOUNTER — Telehealth: Payer: Self-pay | Admitting: Oncology

## 2016-07-13 ENCOUNTER — Ambulatory Visit (HOSPITAL_BASED_OUTPATIENT_CLINIC_OR_DEPARTMENT_OTHER): Payer: Medicare Other | Admitting: Oncology

## 2016-07-13 VITALS — BP 125/79 | HR 107 | Temp 97.8°F | Resp 18 | Ht 63.0 in | Wt 97.5 lb

## 2016-07-13 DIAGNOSIS — Z7901 Long term (current) use of anticoagulants: Secondary | ICD-10-CM

## 2016-07-13 DIAGNOSIS — R112 Nausea with vomiting, unspecified: Secondary | ICD-10-CM | POA: Diagnosis not present

## 2016-07-13 DIAGNOSIS — C439 Malignant melanoma of skin, unspecified: Secondary | ICD-10-CM

## 2016-07-13 DIAGNOSIS — E46 Unspecified protein-calorie malnutrition: Secondary | ICD-10-CM

## 2016-07-13 DIAGNOSIS — I6340 Cerebral infarction due to embolism of unspecified cerebral artery: Secondary | ICD-10-CM | POA: Diagnosis not present

## 2016-07-13 DIAGNOSIS — C770 Secondary and unspecified malignant neoplasm of lymph nodes of head, face and neck: Secondary | ICD-10-CM | POA: Diagnosis not present

## 2016-07-13 DIAGNOSIS — C799 Secondary malignant neoplasm of unspecified site: Secondary | ICD-10-CM

## 2016-07-13 DIAGNOSIS — C3401 Malignant neoplasm of right main bronchus: Secondary | ICD-10-CM

## 2016-07-13 DIAGNOSIS — C430 Malignant melanoma of lip: Secondary | ICD-10-CM

## 2016-07-13 DIAGNOSIS — C787 Secondary malignant neoplasm of liver and intrahepatic bile duct: Secondary | ICD-10-CM | POA: Diagnosis not present

## 2016-07-13 DIAGNOSIS — R110 Nausea: Secondary | ICD-10-CM

## 2016-07-13 DIAGNOSIS — C7800 Secondary malignant neoplasm of unspecified lung: Secondary | ICD-10-CM | POA: Diagnosis not present

## 2016-07-13 DIAGNOSIS — E876 Hypokalemia: Secondary | ICD-10-CM

## 2016-07-13 DIAGNOSIS — R634 Abnormal weight loss: Secondary | ICD-10-CM

## 2016-07-13 LAB — COMPREHENSIVE METABOLIC PANEL
ALBUMIN: 2.6 g/dL — AB (ref 3.5–5.0)
ALT: 20 U/L (ref 0–55)
AST: 23 U/L (ref 5–34)
Alkaline Phosphatase: 119 U/L (ref 40–150)
Anion Gap: 13 mEq/L — ABNORMAL HIGH (ref 3–11)
BUN: 9.4 mg/dL (ref 7.0–26.0)
CHLORIDE: 100 meq/L (ref 98–109)
CO2: 28 meq/L (ref 22–29)
Calcium: 9.5 mg/dL (ref 8.4–10.4)
Creatinine: 0.6 mg/dL (ref 0.6–1.1)
EGFR: 87 mL/min/{1.73_m2} — AB (ref >90)
GLUCOSE: 147 mg/dL — AB (ref 70–140)
POTASSIUM: 3 meq/L — AB (ref 3.5–5.1)
SODIUM: 141 meq/L (ref 136–145)
Total Bilirubin: 0.38 mg/dL (ref 0.20–1.20)
Total Protein: 6.2 g/dL — ABNORMAL LOW (ref 6.4–8.3)

## 2016-07-13 MED ORDER — SODIUM CHLORIDE 0.9 % IV SOLN
INTRAVENOUS | Status: DC
  Administered 2016-07-13: 10:00:00 via INTRAVENOUS
  Filled 2016-07-13 (×2): qty 500

## 2016-07-13 NOTE — Addendum Note (Signed)
Addended by: Wyatt Portela on: 07/13/2016 09:19 AM   Modules accepted: Orders

## 2016-07-13 NOTE — Progress Notes (Signed)
Hematology and Oncology Follow Up Visit  Victoria Castro [id number] 1941-04-24 76 y.o. 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 She developed subsequently cervical adenopathy in June 2017. Her final pathological staging was IIIB ([id number]).  She was found to have metastatic melanoma in January 2018. The BRAF testing is pending.   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 She underwent Mohs procedure in February 2017 which did not show any residual malignancy at that time.  She subsequently started developing a tender mass in her right submandibular region which did not improve with antibiotics. Evaluate her aspiration done on 09/16/2015 showed atypical cells suspicious for malignancy.  PET scan showed right-sided lymphadenopathy at the level of IIa. She subsequently underwent a repeat biopsy on 10/02/2015 which confirmed the presence of metastatic melanoma. She is status post neck dissection performed on 10/22/2015 which showed 2 out of 7 lymph nodes involved with metastatic melanoma. Adjuvant ipilimumab at reduced dose of 3 mg/kg every 3 weeks for a total of 4 doses. She is supposed 4 treatments completed in 03/17/2016. She is status post liver biopsy on 06/11/2016. The biopsy confirmed the presence of metastatic melanoma with BRAF testing still pending.  Current therapy: Supportive management.  Interim History: Victoria Castro presents today for a follow-up visit. Since the last visit, she reports feeling stronger and eating better. She has maintained her weight and continues to receive physical therapy. Her ambulation is still limited but has not reported any falls or syncope. She is able to eat and drink at this time with the help of low-dose dexamethasone. She also taken Remeron at  nighttime which also helped her symptoms. She denied any dysphagia or difficulty swallowing.   She does not report any neurological symptoms. She denied any syncope or seizures. She denied any fevers or chills or sweats. She does not report any wheezing or hemoptysis. She does not report any chest pain, palpitation, orthopnea or leg edema. She does not report any frequency urgency or hesitancy. She does not report any skeletal complaints. Remaining review of systems unremarkable.   Medications: I have reviewed the patient's current medications.  Current Outpatient Prescriptions  Medication Sig Dispense Refill  . amLODipine-benazepril (LOTREL) 5-20 MG capsule Take 1 capsule by mouth daily. Hold for SBP less than 130 10 capsule 0  . dexamethasone (DECADRON) 2 MG tablet Take 1 tablet (2 mg total) by mouth daily. 30 tablet 0  . enoxaparin (LOVENOX) 40 MG/0.4ML injection Inject 0.4 mLs (40 mg total) into the skin every 12 (twelve) hours. 60 Syringe 0  . levothyroxine (SYNTHROID, LEVOTHROID) 50 MCG tablet Take 50 mcg by mouth daily before breakfast.    . mirtazapine (REMERON) 15 MG tablet Take 1 tablet (15 mg total) by mouth at bedtime. 30 tablet 1  . ondansetron (ZOFRAN ODT) 8 MG disintegrating tablet Take 1 tablet (8 mg total) by mouth every 8 (eight) hours as needed for nausea or vomiting. 30 tablet 1  . pantoprazole (PROTONIX) 40 MG tablet Take 1 tablet (40 mg total) by mouth 2 (two) times daily. 60 tablet 2  . prochlorperazine (COMPAZINE) 10 MG tablet Take 1 tablet (10 mg total) by mouth every 6 (six) hours as needed for nausea or vomiting. 30 tablet 1  . promethazine (PHENERGAN) 25 MG tablet Take 25 mg by mouth every  6 (six) hours as needed for nausea or vomiting.     No current facility-administered medications for this visit.      Allergies: No Known Allergies  Past Medical History, Surgical history, Social history, and Family History were reviewed and updated.   Physical Exam: Blood  pressure 125/79, pulse (!) 107, temperature 97.8 F (36.6 C), temperature source Oral, resp. rate 18, height _0  (1.6 m), weight 97 lb 8 oz (44.2 kg), SpO2 (!) 0 %. ECOG: 1 General appearance: Chronically ill-appearing woman appeared without distress. Head: Normocephalic, without obvious abnormality. Mucous membranes without any thrush or ulcers. Neck: no adenopathy Lymph nodes: Cervical, supraclavicular, and axillary nodes normal. Heart:regular rate and rhythm, S1, S2 normal, no murmur, click, rub or gallop Lung:chest clear, no wheezing, rales, normal symmetric air entry.  Abdomin: soft, non-tender, without masses or organomegaly no shifting dullness or ascites. EXT:no erythema, induration, or nodules no edema noted. Neurological examination: No deficits noted.  Lab Results: Lab Results  Component Value Date   WBC 14.2 (H) 07/03/2016   HGB 14.7 07/03/2016   HCT 45.6 07/03/2016   MCV 83.2 07/03/2016   PLT 142 (L) 07/03/2016     Chemistry      Component Value Date/Time   NA 141 07/03/2016 0844   NA 138 05/28/2016 1040   K 3.6 07/03/2016 0844   K 3.7 05/28/2016 1040   CL 103 07/03/2016 0844   CL 100 02/21/2014 0902   CO2 26 07/03/2016 0844   CO2 21 (L) 05/28/2016 1040   BUN 11 07/03/2016 0844   BUN 19.3 05/28/2016 1040   CREATININE 0.55 07/03/2016 0844   CREATININE 0.7 05/28/2016 1040      Component Value Date/Time   CALCIUM 9.3 07/03/2016 0844   CALCIUM 10.9 (H) 05/28/2016 1040   ALKPHOS 104 07/02/2016 2120   ALKPHOS 129 05/28/2016 1040   AST 33 07/02/2016 2120   AST 10 05/28/2016 1040   ALT 22 07/02/2016 2120   ALT 9 05/28/2016 1040   BILITOT 0.8 07/02/2016 2120   BILITOT 0.94 05/28/2016 1040      Impression and Plan:  76 year old woman with the following issues:  1. 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 She was initially found to have a lesion on the right upper lip and subsequently developed cervical adenopathy with the final  pathological staging as IIIB (TisN2aM0). She is status post Mohs procedure followed by neck dissection completed in June 2017.  She is S/P adjuvant therapy utilizing ipilimumab at 3 mg/kg every 3 weeks. She is status post 4 treatments completed without acute toxicities. She did develop delayed toxicity including hypothyroidism which she is currently being treated for.  PET CT scan obtained on 05/28/2016 showed evidence of metastatic disease in the liver as well as in the lung.   The biopsy from her liver mass confirmed the presence of melanoma but her BRAF testing could not be completed because quantity was not sufficient.  BRAF testing utilizing [employer] platform is currently pending and any treatment will be dictated by this test at this time.   2. Nausea and vomiting with poor by mouth intake and malnutrition: Improved at this time after low-dose dexamethasone and Remeron.  3. Hypercalcemia: Related to malignancy. She is status post Zometa on January 11 and her calcium corrected on January 25. This was repeated on 07/03/2016 and appeared to be within normal range. This was repeated on 07/13/2016.  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 Her echocardiogram and Doppler's  did not show any thrombosis. She is currently on Lovenox for long-term treatment.  5. Prognosis: She understands that she is an incurable malignancy with poor prognosis. Her nutritional status and performance status has improved slightly which all also potentially to undergo cancer treatment in the near future.  6. Follow-up: Will be 1 week to follow her status.   [facility], MD 2/26/20189:04 AM

## 2016-07-13 NOTE — Telephone Encounter (Signed)
Per Dr Alen Blew, (verbal) 2 hrs IVF 03/02, 03/05 and 03/09. Lab and follow up appointments scheduled per 02/026/18 los. Patient was given a copy of the AVS report and appointment schedule per 07/13/16 los.

## 2016-07-13 NOTE — Patient Instructions (Signed)
Dehydration, Adult Dehydration is a condition in which there is not enough fluid or water in the body. This happens when you lose more fluids than you take in. Important organs, such as the kidneys, brain, and heart, cannot function without a proper amount of fluids. Any loss of fluids from the body can lead to dehydration. Dehydration can range from mild to severe. This condition should be treated right away to prevent it from becoming severe. What are the causes? This condition may be caused by:  Vomiting.  Diarrhea.  Excessive sweating, such as from heat exposure or exercise.  Not drinking enough fluid, especially:  When ill.  While doing activity that requires a lot of energy.  Excessive urination.  Fever.  Infection.  Certain medicines, such as medicines that cause the body to lose excess fluid (diuretics).  Inability to access safe drinking water.  Reduced physical ability to get adequate water and food. What increases the risk? This condition is more likely to develop in people:  Who have a poorly controlled long-term (chronic) illness, such as diabetes, heart disease, or kidney disease.  Who are age 75 or older.  Who are disabled.  Who live in a place with high altitude.  Who play endurance sports. What are the signs or symptoms? Symptoms of mild dehydration may include:  Thirst.  Dry lips.  Slightly dry mouth.  Dry, warm skin.  Dizziness. Symptoms of moderate dehydration may include:  Very dry mouth.  Muscle cramps.  Dark urine. Urine may be the color of tea.  Decreased urine production.  Decreased tear production.  Heartbeat that is irregular or faster than normal (palpitations).  Headache.  Light-headedness, especially when you stand up from a sitting position.  Fainting (syncope). Symptoms of severe dehydration may include:  Changes in skin, such as:  Cold and clammy skin.  Blotchy (mottled) or pale skin.  Skin that does not  quickly return to normal after being lightly pinched and released (poor skin turgor).  Changes in body fluids, such as:  Extreme thirst.  No tear production.  Inability to sweat when body temperature is high, such as in hot weather.  Very little urine production.  Changes in vital signs, such as:  Weak pulse.  Pulse that is more than 100 beats a minute when sitting still.  Rapid breathing.  Low blood pressure.  Other changes, such as:  Sunken eyes.  Cold hands and feet.  Confusion.  Lack of energy (lethargy).  Difficulty waking up from sleep.  Short-term weight loss.  Unconsciousness. How is this diagnosed? This condition is diagnosed based on your symptoms and a physical exam. Blood and urine tests may be done to help confirm the diagnosis. How is this treated? Treatment for this condition depends on the severity. Mild or moderate dehydration can often be treated at home. Treatment should be started right away. Do not wait until dehydration becomes severe. Severe dehydration is an emergency and it needs to be treated in a hospital. Treatment for mild dehydration may include:  Drinking more fluids.  Replacing salts and minerals in your blood (electrolytes) that you may have lost. Treatment for moderate dehydration may include:  Drinking an oral rehydration solution (ORS). This is a drink that helps you replace fluids and electrolytes (rehydrate). It can be found at pharmacies and retail stores. Treatment for severe dehydration may include:  Receiving fluids through an IV tube.  Receiving an electrolyte solution through a feeding tube that is passed through your nose and into  your stomach (nasogastric tube, or NG tube).  Correcting any abnormalities in electrolytes.  Treating the underlying cause of dehydration. Follow these instructions at home:  If directed by your health care provider, drink an ORS:  Make an ORS by following instructions on the  package.  Start by drinking small amounts, about  cup (120 mL) every 5-10 minutes.  Slowly increase how much you drink until you have taken the amount recommended by your health care provider.  Drink enough clear fluid to keep your urine clear or pale yellow. If you were told to drink an ORS, finish the ORS first, then start slowly drinking other clear fluids. Drink fluids such as:  Water. Do not drink only water. Doing that can lead to having too little salt (sodium) in the body (hyponatremia).  Ice chips.  Fruit juice that you have added water to (diluted fruit juice).  Low-calorie sports drinks.  Avoid:  Alcohol.  Drinks that contain a lot of sugar. These include high-calorie sports drinks, fruit juice that is not diluted, and soda.  Caffeine.  Foods that are greasy or contain a lot of fat or sugar.  Take over-the-counter and prescription medicines only as told by your health care provider.  Do not take sodium tablets. This can lead to having too much sodium in the body (hypernatremia).  Eat foods that contain a healthy balance of electrolytes, such as bananas, oranges, potatoes, tomatoes, and spinach.  Keep all follow-up visits as told by your health care provider. This is important. Contact a health care provider if:  You have abdominal pain that:  Gets worse.  Stays in one area (localizes).  You have a rash.  You have a stiff neck.  You are more irritable than usual.  You are sleepier or more difficult to wake up than usual.  You feel weak or dizzy.  You feel very thirsty.  You have urinated only a small amount of very dark urine over 6-8 hours. Get help right away if:  You have symptoms of severe dehydration.  You cannot drink fluids without vomiting.  Your symptoms get worse with treatment.  You have a fever.  You have a severe headache.  You have vomiting or diarrhea that:  Gets worse.  Does not go away.  You have blood or green matter  (bile) in your vomit.  You have blood in your stool. This may cause stool to look black and tarry.  You have not urinated in 6-8 hours.  You faint.  Your heart rate while sitting still is over 100 beats a minute.  You have trouble breathing. This information is not intended to replace advice given to you by your health care provider. Make sure you discuss any questions you have with your health care provider. Document Released: 05/04/2005 Document Revised: 11/29/2015 Document Reviewed: 06/28/2015 Elsevier Interactive Patient Education  2017 [place].    Hypokalemia Hypokalemia means that the amount of potassium in the blood is lower than normal.Potassium is a chemical that helps regulate the amount of fluid in the body (electrolyte). It also stimulates muscle tightening (contraction) and helps nerves work properly.Normally, most of the body's potassium is inside of cells, and only a very small amount is in the blood. Because the amount in the blood is so small, minor changes to potassium levels in the blood can be life-threatening. What are the causes? This condition may be caused by:  Antibiotic medicine.  Diarrhea or vomiting. Taking too much of a medicine that helps  you have a bowel movement (laxative) can cause diarrhea and lead to hypokalemia.  Chronic kidney disease (CKD).  Medicines that help the body get rid of excess fluid (diuretics).  Eating disorders, such as bulimia.  Low magnesium levels in the body.  Sweating a lot. What are the signs or symptoms? Symptoms of this condition include:  Weakness.  Constipation.  Fatigue.  Muscle cramps.  Mental confusion.  Skipped heartbeats or irregular heartbeat (palpitations).  Tingling or numbness. How is this diagnosed? This condition is diagnosed with a blood test. How is this treated? Hypokalemia can be treated by taking potassium supplements by mouth or adjusting the medicines that you take. Treatment  may also include eating more foods that contain a lot of potassium. If your potassium level is very low, you may need to get potassium through an IV tube in one of your veins and be monitored in the hospital. Follow these instructions at home:  Take over-the-counter and prescription medicines only as told by your health care provider. This includes vitamins and supplements.  Eat a healthy diet. A healthy diet includes fresh fruits and vegetables, whole grains, healthy fats, and lean proteins.  If instructed, eat more foods that contain a lot of potassium, such as:  Nuts, such as peanuts and pistachios.  Seeds, such as sunflower seeds and pumpkin seeds.  Peas, lentils, and lima beans.  Whole grain and bran cereals and breads.  Fresh fruits and vegetables, such as apricots, avocado, bananas, cantaloupe, kiwi, oranges, tomatoes, asparagus, and potatoes.  Orange juice.  Tomato juice.  Red meats.  Yogurt.  Keep all follow-up visits as told by your health care provider. This is important. Contact a health care provider if:  You have weakness that gets worse.  You feel your heart pounding or racing.  You vomit.  You have diarrhea.  You have diabetes (diabetes mellitus) and you have trouble keeping your blood sugar (glucose) in your target range. Get help right away if:  You have chest pain.  You have shortness of breath.  You have vomiting or diarrhea that lasts for more than 2 days.  You faint. This information is not intended to replace advice given to you by your health care provider. Make sure you discuss any questions you have with your health care provider. Document Released: 05/04/2005 Document Revised: 12/21/2015 Document Reviewed: 12/21/2015 Elsevier Interactive Patient Education  2017 [employer].

## 2016-07-13 NOTE — Progress Notes (Signed)
RN visit only for IVF

## 2016-07-17 ENCOUNTER — Ambulatory Visit (HOSPITAL_BASED_OUTPATIENT_CLINIC_OR_DEPARTMENT_OTHER): Payer: Medicare Other

## 2016-07-17 VITALS — BP 141/61 | HR 100 | Temp 97.7°F | Resp 16

## 2016-07-17 DIAGNOSIS — C7800 Secondary malignant neoplasm of unspecified lung: Secondary | ICD-10-CM | POA: Diagnosis not present

## 2016-07-17 DIAGNOSIS — C787 Secondary malignant neoplasm of liver and intrahepatic bile duct: Secondary | ICD-10-CM

## 2016-07-17 DIAGNOSIS — C770 Secondary and unspecified malignant neoplasm of lymph nodes of head, face and neck: Secondary | ICD-10-CM

## 2016-07-17 DIAGNOSIS — C439 Malignant melanoma of skin, unspecified: Secondary | ICD-10-CM

## 2016-07-17 DIAGNOSIS — E46 Unspecified protein-calorie malnutrition: Secondary | ICD-10-CM | POA: Diagnosis not present

## 2016-07-17 DIAGNOSIS — R112 Nausea with vomiting, unspecified: Secondary | ICD-10-CM

## 2016-07-17 DIAGNOSIS — C430 Malignant melanoma of lip: Secondary | ICD-10-CM

## 2016-07-17 MED ORDER — SODIUM CHLORIDE 0.9 % IV SOLN
Freq: Once | INTRAVENOUS | Status: AC
  Administered 2016-07-17: 08:00:00 via INTRAVENOUS

## 2016-07-17 NOTE — Patient Instructions (Signed)
Dehydration, Adult Dehydration is when there is not enough fluid or water in your body. This happens when you lose more fluids than you take in. Dehydration can range from mild to very bad. It should be treated right away to keep it from getting very bad. Symptoms of mild dehydration may include:   Thirst.  Dry lips.  Slightly dry mouth.  Dry, warm skin.  Dizziness. Symptoms of moderate dehydration may include:   Very dry mouth.  Muscle cramps.  Dark pee (urine). Pee may be the color of tea.  Your body making less pee.  Your eyes making fewer tears.  Heartbeat that is uneven or faster than normal (palpitations).  Headache.  Light-headedness, especially when you stand up from sitting.  Fainting (syncope). Symptoms of very bad dehydration may include:   Changes in skin, such as:  Cold and clammy skin.  Blotchy (mottled) or pale skin.  Skin that does not quickly return to normal after being lightly pinched and let go (poor skin turgor).  Changes in body fluids, such as:  Feeling very thirsty.  Your eyes making fewer tears.  Not sweating when body temperature is high, such as in hot weather.  Your body making very little pee.  Changes in vital signs, such as:  Weak pulse.  Pulse that is more than 100 beats a minute when you are sitting still.  Fast breathing.  Low blood pressure.  Other changes, such as:  Sunken eyes.  Cold hands and feet.  Confusion.  Lack of energy (lethargy).  Trouble waking up from sleep.  Short-term weight loss.  Unconsciousness. Follow these instructions at home:  If told by your doctor, drink an ORS:  Make an ORS by using instructions on the package.  Start by drinking small amounts, about  cup (120 mL) every 5-10 minutes.  Slowly drink more until you have had the amount that your doctor said to have.  Drink enough clear fluid to keep your pee clear or pale yellow. If you were told to drink an ORS, finish the  ORS first, then start slowly drinking clear fluids. Drink fluids such as:  Water. Do not drink only water by itself. Doing that can make the salt (sodium) level in your body get too low (hyponatremia).  Ice chips.  Fruit juice that you have added water to (diluted).  Low-calorie sports drinks.  Avoid:  Alcohol.  Drinks that have a lot of sugar. These include high-calorie sports drinks, fruit juice that does not have water added, and soda.  Caffeine.  Foods that are greasy or have a lot of fat or sugar.  Take over-the-counter and prescription medicines only as told by your doctor.  Do not take salt tablets. Doing that can make the salt level in your body get too high (hypernatremia).  Eat foods that have minerals (electrolytes). Examples include bananas, oranges, potatoes, tomatoes, and spinach.  Keep all follow-up visits as told by your doctor. This is important. Contact a doctor if:  You have belly (abdominal) pain that:  Gets worse.  Stays in one area (localizes).  You have a rash.  You have a stiff neck.  You get angry or annoyed more easily than normal (irritability).  You are more sleepy than normal.  You have a harder time waking up than normal.  You feel:  Weak.  Dizzy.  Very thirsty.  You have peed (urinated) only a small amount of very dark pee during 6-8 hours. Get help right away if:  You   have symptoms of very bad dehydration.  You cannot drink fluids without throwing up (vomiting).  Your symptoms get worse with treatment.  You have a fever.  You have a very bad headache.  You are throwing up or having watery poop (diarrhea) and it:  Gets worse.  Does not go away.  You have blood or something green (bile) in your throw-up.  You have blood in your poop (stool). This may cause poop to look black and tarry.  You have not peed in 6-8 hours.  You pass out (faint).  Your heart rate when you are sitting still is more than 100 beats a  minute.  You have trouble breathing. This information is not intended to replace advice given to you by your health care provider. Make sure you discuss any questions you have with your health care provider. Document Released: 02/28/2009 Document Revised: 11/22/2015 Document Reviewed: 06/28/2015 Elsevier Interactive Patient Education  2017 [employer]  

## 2016-07-20 ENCOUNTER — Ambulatory Visit (HOSPITAL_BASED_OUTPATIENT_CLINIC_OR_DEPARTMENT_OTHER): Payer: Medicare Other | Admitting: Oncology

## 2016-07-20 ENCOUNTER — Telehealth: Payer: Self-pay | Admitting: Oncology

## 2016-07-20 ENCOUNTER — Ambulatory Visit (HOSPITAL_BASED_OUTPATIENT_CLINIC_OR_DEPARTMENT_OTHER): Payer: Medicare Other

## 2016-07-20 ENCOUNTER — Other Ambulatory Visit (HOSPITAL_BASED_OUTPATIENT_CLINIC_OR_DEPARTMENT_OTHER): Payer: Medicare Other

## 2016-07-20 ENCOUNTER — Ambulatory Visit: Payer: Medicare Other

## 2016-07-20 VITALS — BP 135/79 | HR 107 | Temp 97.8°F | Resp 18 | Ht 63.0 in | Wt 94.8 lb

## 2016-07-20 DIAGNOSIS — C7800 Secondary malignant neoplasm of unspecified lung: Secondary | ICD-10-CM | POA: Diagnosis not present

## 2016-07-20 DIAGNOSIS — Z7189 Other specified counseling: Secondary | ICD-10-CM | POA: Insufficient documentation

## 2016-07-20 DIAGNOSIS — E46 Unspecified protein-calorie malnutrition: Secondary | ICD-10-CM | POA: Diagnosis not present

## 2016-07-20 DIAGNOSIS — C430 Malignant melanoma of lip: Secondary | ICD-10-CM | POA: Diagnosis not present

## 2016-07-20 DIAGNOSIS — C787 Secondary malignant neoplasm of liver and intrahepatic bile duct: Secondary | ICD-10-CM

## 2016-07-20 DIAGNOSIS — C770 Secondary and unspecified malignant neoplasm of lymph nodes of head, face and neck: Secondary | ICD-10-CM

## 2016-07-20 DIAGNOSIS — R112 Nausea with vomiting, unspecified: Secondary | ICD-10-CM

## 2016-07-20 DIAGNOSIS — C439 Malignant melanoma of skin, unspecified: Secondary | ICD-10-CM

## 2016-07-20 DIAGNOSIS — C799 Secondary malignant neoplasm of unspecified site: Secondary | ICD-10-CM

## 2016-07-20 DIAGNOSIS — Z8673 Personal history of transient ischemic attack (TIA), and cerebral infarction without residual deficits: Secondary | ICD-10-CM | POA: Diagnosis not present

## 2016-07-20 LAB — CBC WITH DIFFERENTIAL/PLATELET
BASO%: 0.1 % (ref 0.0–2.0)
BASOS ABS: 0 10*3/uL (ref 0.0–0.1)
EOS%: 0.1 % (ref 0.0–7.0)
Eosinophils Absolute: 0 10*3/uL (ref 0.0–0.5)
HEMATOCRIT: 45 % (ref 34.8–46.6)
HGB: 14.4 g/dL (ref 11.6–15.9)
LYMPH#: 1.3 10*3/uL (ref 0.9–3.3)
LYMPH%: 10.6 % — ABNORMAL LOW (ref 14.0–49.7)
MCH: 27.1 pg (ref 25.1–34.0)
MCHC: 32 g/dL (ref 31.5–36.0)
MCV: 84.6 fL (ref 79.5–101.0)
MONO#: 1 10*3/uL — AB (ref 0.1–0.9)
MONO%: 7.7 % (ref 0.0–14.0)
NEUT#: 10 10*3/uL — ABNORMAL HIGH (ref 1.5–6.5)
NEUT%: 81.5 % — AB (ref 38.4–76.8)
PLATELETS: 212 10*3/uL (ref 145–400)
RBC: 5.32 10*6/uL (ref 3.70–5.45)
RDW: 16.6 % — ABNORMAL HIGH (ref 11.2–14.5)
WBC: 12.3 10*3/uL — ABNORMAL HIGH (ref 3.9–10.3)

## 2016-07-20 LAB — COMPREHENSIVE METABOLIC PANEL
ALBUMIN: 2.7 g/dL — AB (ref 3.5–5.0)
ALK PHOS: 142 U/L (ref 40–150)
ALT: 14 U/L (ref 0–55)
ANION GAP: 12 meq/L — AB (ref 3–11)
AST: 14 U/L (ref 5–34)
BILIRUBIN TOTAL: 0.55 mg/dL (ref 0.20–1.20)
BUN: 12.3 mg/dL (ref 7.0–26.0)
CALCIUM: 10 mg/dL (ref 8.4–10.4)
CHLORIDE: 101 meq/L (ref 98–109)
CO2: 25 mEq/L (ref 22–29)
Creatinine: 0.6 mg/dL (ref 0.6–1.1)
EGFR: 86 mL/min/{1.73_m2} — ABNORMAL LOW (ref >90)
Glucose: 118 mg/dl (ref 70–140)
Potassium: 3.8 mEq/L (ref 3.5–5.1)
Sodium: 138 mEq/L (ref 136–145)
TOTAL PROTEIN: 6.6 g/dL (ref 6.4–8.3)

## 2016-07-20 MED ORDER — SODIUM CHLORIDE 0.9 % IV SOLN
Freq: Once | INTRAVENOUS | Status: AC
  Administered 2016-07-20: 09:00:00 via INTRAVENOUS

## 2016-07-20 MED ORDER — DEXAMETHASONE 1 MG PO TABS: 1.0000 mg | ORAL_TABLET | Freq: Every day | ORAL | 0 refills | Status: DC

## 2016-07-20 NOTE — Progress Notes (Signed)
Patient saw Dr. Alen Blew this am, but states she forgot to mention cough. States cough is chronic x "years." Productive with clear mucous. Denies fever and/or dyspnea. Claims her daughter wants Dr. Alen Blew to be aware. Left voicemail for desk nurse to notify Dr. Alen Blew. Advised to call infusion room if new orders received. Patient aware.

## 2016-07-20 NOTE — Patient Instructions (Signed)
Dehydration, Adult Dehydration is when there is not enough fluid or water in your body. This happens when you lose more fluids than you take in. Dehydration can range from mild to very bad. It should be treated right away to keep it from getting very bad. Symptoms of mild dehydration may include:   Thirst.  Dry lips.  Slightly dry mouth.  Dry, warm skin.  Dizziness. Symptoms of moderate dehydration may include:   Very dry mouth.  Muscle cramps.  Dark pee (urine). Pee may be the color of tea.  Your body making less pee.  Your eyes making fewer tears.  Heartbeat that is uneven or faster than normal (palpitations).  Headache.  Light-headedness, especially when you stand up from sitting.  Fainting (syncope). Symptoms of very bad dehydration may include:   Changes in skin, such as:  Cold and clammy skin.  Blotchy (mottled) or pale skin.  Skin that does not quickly return to normal after being lightly pinched and let go (poor skin turgor).  Changes in body fluids, such as:  Feeling very thirsty.  Your eyes making fewer tears.  Not sweating when body temperature is high, such as in hot weather.  Your body making very little pee.  Changes in vital signs, such as:  Weak pulse.  Pulse that is more than 100 beats a minute when you are sitting still.  Fast breathing.  Low blood pressure.  Other changes, such as:  Sunken eyes.  Cold hands and feet.  Confusion.  Lack of energy (lethargy).  Trouble waking up from sleep.  Short-term weight loss.  Unconsciousness. Follow these instructions at home:  If told by your doctor, drink an ORS:  Make an ORS by using instructions on the package.  Start by drinking small amounts, about  cup (120 mL) every 5-10 minutes.  Slowly drink more until you have had the amount that your doctor said to have.  Drink enough clear fluid to keep your pee clear or pale yellow. If you were told to drink an ORS, finish the  ORS first, then start slowly drinking clear fluids. Drink fluids such as:  Water. Do not drink only water by itself. Doing that can make the salt (sodium) level in your body get too low (hyponatremia).  Ice chips.  Fruit juice that you have added water to (diluted).  Low-calorie sports drinks.  Avoid:  Alcohol.  Drinks that have a lot of sugar. These include high-calorie sports drinks, fruit juice that does not have water added, and soda.  Caffeine.  Foods that are greasy or have a lot of fat or sugar.  Take over-the-counter and prescription medicines only as told by your doctor.  Do not take salt tablets. Doing that can make the salt level in your body get too high (hypernatremia).  Eat foods that have minerals (electrolytes). Examples include bananas, oranges, potatoes, tomatoes, and spinach.  Keep all follow-up visits as told by your doctor. This is important. Contact a doctor if:  You have belly (abdominal) pain that:  Gets worse.  Stays in one area (localizes).  You have a rash.  You have a stiff neck.  You get angry or annoyed more easily than normal (irritability).  You are more sleepy than normal.  You have a harder time waking up than normal.  You feel:  Weak.  Dizzy.  Very thirsty.  You have peed (urinated) only a small amount of very dark pee during 6-8 hours. Get help right away if:  You   have symptoms of very bad dehydration.  You cannot drink fluids without throwing up (vomiting).  Your symptoms get worse with treatment.  You have a fever.  You have a very bad headache.  You are throwing up or having watery poop (diarrhea) and it:  Gets worse.  Does not go away.  You have blood or something green (bile) in your throw-up.  You have blood in your poop (stool). This may cause poop to look black and tarry.  You have not peed in 6-8 hours.  You pass out (faint).  Your heart rate when you are sitting still is more than 100 beats a  minute.  You have trouble breathing. This information is not intended to replace advice given to you by your health care provider. Make sure you discuss any questions you have with your health care provider. Document Released: 02/28/2009 Document Revised: 11/22/2015 Document Reviewed: 06/28/2015 Elsevier Interactive Patient Education  2017 [employer]  

## 2016-07-20 NOTE — Progress Notes (Signed)
START ON PATHWAY REGIMEN - Melanoma     A cycle is 21 days:     Pembrolizumab        Dose Mod: None  **Always confirm dose/schedule in your pharmacy ordering system**    Patient Characteristics: Stage IV Metastatic, Asymptomatic, First Line, BRAF V600 Wild Type / BRAF V600 Results Pending or Unknown Disease Subtype: Cutaneous Current Disease Status: Distant Metastases AJCC 8 Stage Grouping: IV AJCC T Category: Staged < 8th Ed. AJCC N Category: Staged < 8th Ed. AJCC M Category: M1c(1) Mutation Status: BRAF V600 Wild Type (No Mutation) Metastatic Disease Type: Asymptomatic Would you be surprised if this patient died  in the next year? I would NOT be surprised if this patient died in the next year Line of Therapy: First Line  Intent of Therapy: Curative Intent, Not Discussed with Patient

## 2016-07-20 NOTE — Progress Notes (Signed)
Patient on plan of care prior to pathways. 

## 2016-07-20 NOTE — Telephone Encounter (Signed)
Appointments scheduled per 3/5 LOS. Patient given AVS report and calendars with future scheduled appointments.

## 2016-07-20 NOTE — Progress Notes (Signed)
Hematology and Oncology Follow Up Visit  BRIZEYDA HOLTMEYER [id number] 1940-07-31 76 y.o. 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 She developed subsequently cervical adenopathy in June 2017. Her final pathological staging was IIIB ([id number]).  She was found to have metastatic melanoma in January 2018. The BRAF wild type.    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 She underwent Mohs procedure in February 2017 which did not show any residual malignancy at that time.  She subsequently started developing a tender mass in her right submandibular region which did not improve with antibiotics. Evaluate her aspiration done on 09/16/2015 showed atypical cells suspicious for malignancy.  PET scan showed right-sided lymphadenopathy at the level of IIa. She subsequently underwent a repeat biopsy on 10/02/2015 which confirmed the presence of metastatic melanoma. She is status post neck dissection performed on 10/22/2015 which showed 2 out of 7 lymph nodes involved with metastatic melanoma. Adjuvant ipilimumab at reduced dose of 3 mg/kg every 3 weeks for a total of 4 doses. She is supposed 4 treatments completed in 03/17/2016. She is status post liver biopsy on 06/11/2016. The biopsy confirmed the presence of metastatic melanoma with BRAF testing still pending.  Current therapy: Under evaluation to start systemic therapy.  Interim History: Mrs. Dehnert presents today for a follow-up visit. Since the last visit, she reports reasonable improvement in her performance status and activity level. She is able to eat and drink better at this time. She has maintained her weight and continues to receive physical therapy. Her ambulation is still limited but has not reported any falls or syncope. She denied any  dysphagia or difficulty swallowing. Her performance status and quality of life slightly improved at this time. She continues on Lovenox therapy   She does not report any neurological symptoms. She denied any syncope or seizures. She denied any fevers or chills or sweats. She does not report any wheezing or hemoptysis. She does not report any chest pain, palpitation, orthopnea or leg edema. She does not report any frequency urgency or hesitancy. She does not report any skeletal complaints. Remaining review of systems unremarkable.   Medications: I have reviewed the patient's current medications.  Current Outpatient Prescriptions  Medication Sig Dispense Refill  . amLODipine-benazepril (LOTREL) 5-20 MG capsule Take 1 capsule by mouth daily. Hold for SBP less than 130 10 capsule 0  . enoxaparin (LOVENOX) 40 MG/0.4ML injection Inject 0.4 mLs (40 mg total) into the skin every 12 (twelve) hours. 60 Syringe 0  . levothyroxine (SYNTHROID, LEVOTHROID) 50 MCG tablet Take 50 mcg by mouth daily before breakfast.    . mirtazapine (REMERON) 15 MG tablet Take 1 tablet (15 mg total) by mouth at bedtime. 30 tablet 1  . ondansetron (ZOFRAN ODT) 8 MG disintegrating tablet Take 1 tablet (8 mg total) by mouth every 8 (eight) hours as needed for nausea or vomiting. 30 tablet 1  . pantoprazole (PROTONIX) 40 MG tablet Take 1 tablet (40 mg total) by mouth 2 (two) times daily. 60 tablet 2  . prochlorperazine (COMPAZINE) 10 MG tablet Take 1 tablet (10 mg total) by mouth every 6 (six) hours as needed for nausea or vomiting. 30 tablet 1  . promethazine (PHENERGAN) 25 MG tablet Take 25 mg by mouth every 6 (six) hours as needed for nausea or vomiting.    Marland Kitchen  dexamethasone (DECADRON) 1 MG tablet Take 1 tablet (1 mg total) by mouth daily with breakfast. 7 tablet 0   No current facility-administered medications for this visit.      Allergies: No Known Allergies  Past Medical History, Surgical history, Social history, and Family  History were reviewed and updated.   Physical Exam: Blood pressure 135/79, pulse (!) 107, temperature 97.8 F (36.6 C), temperature source Oral, resp. rate 18, height _0  (1.6 m), weight 94 lb 12.8 oz (43 kg), SpO2 100 %. ECOG: 1 General appearance: Alert, awake woman appeared comfortable. Head: Normocephalic, without obvious abnormality. Oral mucous membranes appeared moist and pink. Neck: no adenopathy Lymph nodes: Cervical, supraclavicular, and axillary nodes normal. Heart:regular rate and rhythm, S1, S2 normal, no murmur, click, rub or gallop Lung:chest clear, no wheezing, rales, normal symmetric air entry.  Abdomin: soft, non-tender, without masses or organomegaly no rebound or guarding. EXT:no erythema, induration, or nodules no edema noted. Neurological examination: No deficits noted.  Lab Results: Lab Results  Component Value Date   WBC 12.3 (H) 07/20/2016   HGB 14.4 07/20/2016   HCT 45.0 07/20/2016   MCV 84.6 07/20/2016   PLT 212 07/20/2016     Chemistry      Component Value Date/Time   NA 141 07/13/2016 0825   K 3.0 (LL) 07/13/2016 0825   CL 103 07/03/2016 0844   CL 100 02/21/2014 0902   CO2 28 07/13/2016 0825   BUN 9.4 07/13/2016 0825   CREATININE 0.6 07/13/2016 0825      Component Value Date/Time   CALCIUM 9.5 07/13/2016 0825   ALKPHOS 119 07/13/2016 0825   AST 23 07/13/2016 0825   ALT 20 07/13/2016 0825   BILITOT 0.38 07/13/2016 0825      Impression and Plan:  76 year old woman with the following issues:  1. 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 She was initially found to have a lesion on the right upper lip and subsequently developed cervical adenopathy with the final pathological staging as IIIB (TisN2aM0). She is status post Mohs procedure followed by neck dissection completed in June 2017.  She is S/P adjuvant therapy utilizing ipilimumab at 3 mg/kg every 3 weeks. She is status post 4 treatments completed without acute  toxicities. She did develop delayed toxicity including hypothyroidism which she is currently being treated for.  PET CT scan obtained on 05/28/2016 showed evidence of metastatic disease in the liver as well as in the lung.   The biopsy from her liver mass confirmed the presence of melanoma but her BRAF testing could not be completed because quantity was not sufficient.  BRAF testing utilizing [employer] platform showed wild-type status.  These findings discussed with the patient and her daughter today. Options of therapy were reviewed which include immune therapy utilizing Pembrolizumab versus supportive care and potentially hospice. After discussion today she is willing to proceed with active treatment. Complications associated with his medications include nausea, fatigue, tiredness, thyroid disease, pneumonitis and skin irritation. She is agreeable to receive this medication starting next week.   2. Nausea and vomiting with poor by mouth intake and malnutrition: Improved at this time after low-dose dexamethasone and Remeron. The plan is to taper dexamethasone within the next week.  3. Hypercalcemia: Related to malignancy. She is status post Zometa on January 11 and her calcium corrected on January 25. Her calcium within normal range.  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 Her echocardiogram and Doppler's did not show any thrombosis. She is  currently on Lovenox for long-term treatment.  5. Prognosis: This was discussed again today and her prognosis is guarded. Any treatment would be with a goal of palliation at best. She understands she has poor tolerance to this treatment hospice will be her next option.  6. Follow-up: Will be 1 week to start systemic therapy.   [facility], MD 3/5/20188:40 AM

## 2016-07-24 ENCOUNTER — Ambulatory Visit: Payer: Medicare Other

## 2016-07-27 ENCOUNTER — Other Ambulatory Visit: Payer: Self-pay

## 2016-07-27 DIAGNOSIS — C439 Malignant melanoma of skin, unspecified: Secondary | ICD-10-CM

## 2016-07-27 DIAGNOSIS — C799 Secondary malignant neoplasm of unspecified site: Secondary | ICD-10-CM

## 2016-07-28 ENCOUNTER — Other Ambulatory Visit (HOSPITAL_BASED_OUTPATIENT_CLINIC_OR_DEPARTMENT_OTHER): Payer: Medicare Other

## 2016-07-28 ENCOUNTER — Ambulatory Visit (HOSPITAL_BASED_OUTPATIENT_CLINIC_OR_DEPARTMENT_OTHER): Payer: Medicare Other

## 2016-07-28 VITALS — BP 140/95 | HR 110 | Temp 97.8°F | Resp 17

## 2016-07-28 DIAGNOSIS — C439 Malignant melanoma of skin, unspecified: Secondary | ICD-10-CM

## 2016-07-28 DIAGNOSIS — C7800 Secondary malignant neoplasm of unspecified lung: Secondary | ICD-10-CM

## 2016-07-28 DIAGNOSIS — C799 Secondary malignant neoplasm of unspecified site: Secondary | ICD-10-CM

## 2016-07-28 DIAGNOSIS — C430 Malignant melanoma of lip: Secondary | ICD-10-CM

## 2016-07-28 DIAGNOSIS — Z5112 Encounter for antineoplastic immunotherapy: Secondary | ICD-10-CM | POA: Diagnosis not present

## 2016-07-28 DIAGNOSIS — Z79899 Other long term (current) drug therapy: Secondary | ICD-10-CM

## 2016-07-28 DIAGNOSIS — C770 Secondary and unspecified malignant neoplasm of lymph nodes of head, face and neck: Secondary | ICD-10-CM

## 2016-07-28 DIAGNOSIS — C787 Secondary malignant neoplasm of liver and intrahepatic bile duct: Secondary | ICD-10-CM

## 2016-07-28 LAB — CBC WITH DIFFERENTIAL/PLATELET
BASO%: 0.1 % (ref 0.0–2.0)
BASOS ABS: 0 10*3/uL (ref 0.0–0.1)
EOS%: 0.1 % (ref 0.0–7.0)
Eosinophils Absolute: 0 10*3/uL (ref 0.0–0.5)
HEMATOCRIT: 46.8 % — AB (ref 34.8–46.6)
HEMOGLOBIN: 14.9 g/dL (ref 11.6–15.9)
LYMPH#: 1.1 10*3/uL (ref 0.9–3.3)
LYMPH%: 8.5 % — ABNORMAL LOW (ref 14.0–49.7)
MCH: 27.2 pg (ref 25.1–34.0)
MCHC: 31.8 g/dL (ref 31.5–36.0)
MCV: 85.4 fL (ref 79.5–101.0)
MONO#: 1 10*3/uL — ABNORMAL HIGH (ref 0.1–0.9)
MONO%: 7.3 % (ref 0.0–14.0)
NEUT%: 84 % — ABNORMAL HIGH (ref 38.4–76.8)
NEUTROS ABS: 11.2 10*3/uL — AB (ref 1.5–6.5)
Platelets: 233 10*3/uL (ref 145–400)
RBC: 5.48 10*6/uL — ABNORMAL HIGH (ref 3.70–5.45)
RDW: 16.3 % — AB (ref 11.2–14.5)
WBC: 13.3 10*3/uL — AB (ref 3.9–10.3)

## 2016-07-28 LAB — COMPREHENSIVE METABOLIC PANEL
ALBUMIN: 2.7 g/dL — AB (ref 3.5–5.0)
ALK PHOS: 158 U/L — AB (ref 40–150)
ALT: 9 U/L (ref 0–55)
AST: 13 U/L (ref 5–34)
Anion Gap: 13 mEq/L — ABNORMAL HIGH (ref 3–11)
BUN: 10.7 mg/dL (ref 7.0–26.0)
CALCIUM: 10.3 mg/dL (ref 8.4–10.4)
CO2: 27 mEq/L (ref 22–29)
CREATININE: 0.6 mg/dL (ref 0.6–1.1)
Chloride: 98 mEq/L (ref 98–109)
EGFR: 86 mL/min/{1.73_m2} — ABNORMAL LOW (ref >90)
GLUCOSE: 102 mg/dL (ref 70–140)
Potassium: 4.4 mEq/L (ref 3.5–5.1)
Sodium: 139 mEq/L (ref 136–145)
Total Bilirubin: 0.53 mg/dL (ref 0.20–1.20)
Total Protein: 6.9 g/dL (ref 6.4–8.3)

## 2016-07-28 LAB — TSH: TSH: 6.987 m[IU]/L — AB (ref 0.308–3.960)

## 2016-07-28 MED ORDER — ONDANSETRON HCL 4 MG/2ML IJ SOLN
INTRAMUSCULAR | Status: AC
  Filled 2016-07-28: qty 2

## 2016-07-28 MED ORDER — ONDANSETRON HCL 4 MG/2ML IJ SOLN
8.0000 mg | Freq: Once | INTRAMUSCULAR | Status: AC
  Administered 2016-07-28: 8 mg via INTRAVENOUS

## 2016-07-28 MED ORDER — SODIUM CHLORIDE 0.9 % IV SOLN
1000.0000 mL | Freq: Once | INTRAVENOUS | Status: AC
  Administered 2016-07-28: 1000 mL via INTRAVENOUS

## 2016-07-28 MED ORDER — SODIUM CHLORIDE 0.9 % IV SOLN: Freq: Once | INTRAVENOUS | Status: DC

## 2016-07-28 MED ORDER — SODIUM CHLORIDE 0.9 % IV SOLN
200.0000 mg | Freq: Once | INTRAVENOUS | Status: AC
  Administered 2016-07-28: 200 mg via INTRAVENOUS
  Filled 2016-07-28: qty 8

## 2016-07-28 NOTE — Patient Instructions (Signed)
[facility] Discharge Instructions for Patients Receiving Chemotherapy  Today you received the following chemotherapy agents:  Keytruda (pembrolizumab)  To help prevent nausea and vomiting after your treatment, we encourage you to take your nausea medication as prescribed.   If you develop nausea and vomiting that is not controlled by your nausea medication, call the clinic.   BELOW ARE SYMPTOMS THAT SHOULD BE REPORTED IMMEDIATELY:  *FEVER GREATER THAN 100.5 F  *CHILLS WITH OR WITHOUT FEVER  NAUSEA AND VOMITING THAT IS NOT CONTROLLED WITH YOUR NAUSEA MEDICATION  *UNUSUAL SHORTNESS OF BREATH  *UNUSUAL BRUISING OR BLEEDING  TENDERNESS IN MOUTH AND THROAT WITH OR WITHOUT PRESENCE OF ULCERS  *URINARY PROBLEMS  *BOWEL PROBLEMS  UNUSUAL RASH Items with * indicate a potential emergency and should be followed up as soon as possible.  Feel free to call the clinic you have any questions or concerns. The clinic phone number is [phone].  Please show the [facility] at check-in to the Emergency Department and triage nurse.

## 2016-07-28 NOTE — Progress Notes (Signed)
Keytruda infusion longer than scheduled d/t need for new PIV

## 2016-07-30 ENCOUNTER — Observation Stay
Admission: EM | Admit: 2016-07-30 | Discharge: 2016-07-31 | Disposition: A | Discharge status: Home / Self Care | Payer: Medicare Other | Attending: Internal Medicine | Admitting: Internal Medicine

## 2016-07-30 ENCOUNTER — Other Ambulatory Visit: Payer: Self-pay

## 2016-07-30 ENCOUNTER — Emergency Department: Payer: Medicare Other

## 2016-07-30 ENCOUNTER — Telehealth: Payer: Self-pay | Admitting: *Deleted

## 2016-07-30 ENCOUNTER — Encounter: Payer: Self-pay | Admitting: *Deleted

## 2016-07-30 ENCOUNTER — Other Ambulatory Visit: Payer: Self-pay | Admitting: *Deleted

## 2016-07-30 DIAGNOSIS — R0789 Other chest pain: Secondary | ICD-10-CM | POA: Diagnosis present

## 2016-07-30 DIAGNOSIS — Z85118 Personal history of other malignant neoplasm of bronchus and lung: Secondary | ICD-10-CM | POA: Diagnosis not present

## 2016-07-30 DIAGNOSIS — E43 Unspecified severe protein-calorie malnutrition: Secondary | ICD-10-CM | POA: Diagnosis not present

## 2016-07-30 DIAGNOSIS — Z7901 Long term (current) use of anticoagulants: Secondary | ICD-10-CM | POA: Insufficient documentation

## 2016-07-30 DIAGNOSIS — E039 Hypothyroidism, unspecified: Secondary | ICD-10-CM | POA: Diagnosis not present

## 2016-07-30 DIAGNOSIS — I313 Pericardial effusion (noninflammatory): Secondary | ICD-10-CM | POA: Diagnosis not present

## 2016-07-30 DIAGNOSIS — C799 Secondary malignant neoplasm of unspecified site: Secondary | ICD-10-CM | POA: Diagnosis not present

## 2016-07-30 DIAGNOSIS — R0602 Shortness of breath: Secondary | ICD-10-CM | POA: Diagnosis present

## 2016-07-30 DIAGNOSIS — J90 Pleural effusion, not elsewhere classified: Secondary | ICD-10-CM | POA: Insufficient documentation

## 2016-07-30 DIAGNOSIS — R0600 Dyspnea, unspecified: Secondary | ICD-10-CM | POA: Insufficient documentation

## 2016-07-30 DIAGNOSIS — I10 Essential (primary) hypertension: Secondary | ICD-10-CM | POA: Diagnosis not present

## 2016-07-30 DIAGNOSIS — Z85820 Personal history of malignant melanoma of skin: Secondary | ICD-10-CM | POA: Diagnosis not present

## 2016-07-30 DIAGNOSIS — Z87891 Personal history of nicotine dependence: Secondary | ICD-10-CM | POA: Diagnosis not present

## 2016-07-30 DIAGNOSIS — Z79899 Other long term (current) drug therapy: Secondary | ICD-10-CM | POA: Diagnosis not present

## 2016-07-30 DIAGNOSIS — E785 Hyperlipidemia, unspecified: Secondary | ICD-10-CM | POA: Diagnosis not present

## 2016-07-30 DIAGNOSIS — Z853 Personal history of malignant neoplasm of breast: Secondary | ICD-10-CM | POA: Diagnosis not present

## 2016-07-30 DIAGNOSIS — I700 Atherosclerosis of aorta: Secondary | ICD-10-CM | POA: Diagnosis not present

## 2016-07-30 DIAGNOSIS — E876 Hypokalemia: Secondary | ICD-10-CM | POA: Diagnosis not present

## 2016-07-30 DIAGNOSIS — Z8673 Personal history of transient ischemic attack (TIA), and cerebral infarction without residual deficits: Secondary | ICD-10-CM | POA: Diagnosis not present

## 2016-07-30 LAB — CBC WITH DIFFERENTIAL/PLATELET
BASOS ABS: 0 10*3/uL (ref 0–0.1)
Basophils Relative: 0 %
EOS PCT: 0 %
Eosinophils Absolute: 0 10*3/uL (ref 0–0.7)
HCT: 42.5 % (ref 35.0–47.0)
Hemoglobin: 13.9 g/dL (ref 12.0–16.0)
LYMPHS PCT: 7 %
Lymphs Abs: 0.9 10*3/uL — ABNORMAL LOW (ref 1.0–3.6)
MCH: 27.1 pg (ref 26.0–34.0)
MCHC: 32.6 g/dL (ref 32.0–36.0)
MCV: 83.1 fL (ref 80.0–100.0)
MONO ABS: 1 10*3/uL — AB (ref 0.2–0.9)
Monocytes Relative: 7 %
Neutro Abs: 11.9 10*3/uL — ABNORMAL HIGH (ref 1.4–6.5)
Neutrophils Relative %: 86 %
PLATELETS: 236 10*3/uL (ref 150–440)
RBC: 5.11 MIL/uL (ref 3.80–5.20)
RDW: 16.8 % — AB (ref 11.5–14.5)
WBC: 13.9 10*3/uL — ABNORMAL HIGH (ref 3.6–11.0)

## 2016-07-30 LAB — COMPREHENSIVE METABOLIC PANEL
ALT: 10 U/L — ABNORMAL LOW (ref 14–54)
ANION GAP: 12 (ref 5–15)
AST: 15 U/L (ref 15–41)
Albumin: 2.6 g/dL — ABNORMAL LOW (ref 3.5–5.0)
Alkaline Phosphatase: 128 U/L — ABNORMAL HIGH (ref 38–126)
BUN: 21 mg/dL — AB (ref 6–20)
CHLORIDE: 96 mmol/L — AB (ref 101–111)
CO2: 26 mmol/L (ref 22–32)
Calcium: 9.1 mg/dL (ref 8.9–10.3)
Creatinine, Ser: 0.61 mg/dL (ref 0.44–1.00)
Glucose, Bld: 99 mg/dL (ref 65–99)
POTASSIUM: 4.1 mmol/L (ref 3.5–5.1)
Sodium: 134 mmol/L — ABNORMAL LOW (ref 135–145)
Total Bilirubin: 0.8 mg/dL (ref 0.3–1.2)
Total Protein: 6.1 g/dL — ABNORMAL LOW (ref 6.5–8.1)

## 2016-07-30 LAB — TROPONIN I: Troponin I: 0.05 ng/mL (ref <0.03)

## 2016-07-30 LAB — LACTIC ACID, PLASMA: LACTIC ACID, VENOUS: 1.8 mmol/L (ref 0.5–1.9)

## 2016-07-30 MED ORDER — IOPAMIDOL (ISOVUE-370) INJECTION 76%
75.0000 mL | Freq: Once | INTRAVENOUS | Status: AC | PRN
  Administered 2016-07-30: 75 mL via INTRAVENOUS

## 2016-07-30 MED ORDER — ENOXAPARIN SODIUM 40 MG/0.4ML ~~LOC~~ SOLN: 40.0000 mg | Freq: Two times a day (BID) | SUBCUTANEOUS | 0 refills | Status: DC

## 2016-07-30 NOTE — Telephone Encounter (Signed)
Lm on vm for daughter Gerre Couch. lovenox refill script e-scribed to [employer].

## 2016-07-30 NOTE — H&P (Signed)
[place] @ [facility] Admission History and Physical [name], D.O.  ---------------------------------------------------------------------------------------------------------------------   PATIENT NAME: Victoria Castro MR#: [id number] DATE OF BIRTH: 1941/02/28 DATE OF ADMISSION: 07/30/2016 PRIMARY CARE PHYSICIAN: [name], MD  REQUESTING/REFERRING PHYSICIAN: ED Dr. Corky Downs  CHIEF COMPLAINT: Chief Complaint  Patient presents with  . Shortness of Breath    HISTORY OF PRESENT ILLNESS: Victoria Castro is a 76 y.o. female with a known history of HTN, HLD, lyng ca, breast ca, metastatic melanoma presents to the emergency department for evaluation of shortness of breath.  Patient was in a usual state of health until today when she experienced acute onset of shortness of breath while climbing stairs, associated with some chest tightness that has since resolved.  She had known about the lymphadenopathy compressing her pulmonary artery and oncology treatments at this point are palliative per notes.    Recent hospitalization in 2/18 with stroke like symptoms.  Otherwise there has been no change in status. Patient has been taking medication as prescribed and there has been no recent change in medication or diet.  There has been no recent illness, travel or sick contacts.    Patient denies fevers/chills, weakness, dizziness, shortness of breath, N/V/C/D, abdominal pain, dysuria/frequency, changes in mental status.   PAST MEDICAL HISTORY: Past Medical History:  Diagnosis Date  . HTN (hypertension)   . Melanoma ([place])   . Protein calorie malnutrition ([place])      PAST SURGICAL HISTORY: Past Surgical History:  Procedure Laterality Date  . BREAST LUMPECTOMY     right  . MELANOMA EXCISION    . THORACOTOMY    . TONSILLECTOMY        SOCIAL HISTORY: Social History  Substance Use Topics  . Smoking status: Former Smoker    Packs/day: 0.50    Years: 18.00    Types:  Cigarettes    Start date: 09/21/1956    Quit date: 02/21/1973  . Smokeless tobacco: Never Used     Comment: quit 40 years ago  . Alcohol use No      FAMILY HISTORY: Family History  Problem Relation Age of Onset  . Heart disease Father   . Esophageal cancer Neg Hx   . Colon cancer Neg Hx   . Colon polyps Neg Hx   . Stomach cancer Neg Hx   . Rectal cancer Neg Hx      MEDICATIONS AT HOME: Prior to Admission medications   Medication Sig Start Date End Date Taking? Authorizing Provider  amLODipine-benazepril (LOTREL) 5-20 MG capsule Take 1 capsule by mouth daily. Hold for SBP less than 130 07/03/16   Belkys A Regalado, MD  dexamethasone (DECADRON) 1 MG tablet Take 1 tablet (1 mg total) by mouth daily with breakfast. 07/20/16   Wyatt Portela, MD  enoxaparin (LOVENOX) 40 MG/0.4ML injection Inject 0.4 mLs (40 mg total) into the skin every 12 (twelve) hours. 07/30/16   Wyatt Portela, MD  levothyroxine (SYNTHROID, LEVOTHROID) 50 MCG tablet Take 50 mcg by mouth daily before breakfast.    Historical Provider, MD  mirtazapine (REMERON) 15 MG tablet Take 1 tablet (15 mg total) by mouth at bedtime. 05/29/16   Wyatt Portela, MD  ondansetron (ZOFRAN ODT) 8 MG disintegrating tablet Take 1 tablet (8 mg total) by mouth every 8 (eight) hours as needed for nausea or vomiting. 06/11/16   Wyatt Portela, MD  pantoprazole (PROTONIX) 40 MG tablet Take 1 tablet (40 mg total) by mouth 2 (two) times daily. 06/25/16  Laban Emperor Zehr, PA-C  prochlorperazine (COMPAZINE) 10 MG tablet Take 1 tablet (10 mg total) by mouth every 6 (six) hours as needed for nausea or vomiting. 06/22/16   Wyatt Portela, MD  promethazine (PHENERGAN) 25 MG tablet Take 25 mg by mouth every 6 (six) hours as needed for nausea or vomiting.    Historical Provider, MD      DRUG ALLERGIES: No Known Allergies   REVIEW OF SYSTEMS: CONSTITUTIONAL: No fatigue, weakness, fever, chills, weight gain/loss, headache EYES: No blurry or double vision. ENT:  No tinnitus, postnasal drip, redness or soreness of the oropharynx. RESPIRATORY: Positive dyspnea, negative cough, wheeze, hemoptysis. CARDIOVASCULAR: Positive chest tightness, negative orthopnea, palpitations, syncope. GASTROINTESTINAL: No nausea, vomiting, constipation, diarrhea, abdominal pain. No hematemesis, melena or hematochezia. GENITOURINARY: No dysuria, frequency, hematuria. ENDOCRINE: No polyuria or nocturia. No heat or cold intolerance. HEMATOLOGY: No anemia, bruising, bleeding. INTEGUMENTARY: No rashes, ulcers, lesions. MUSCULOSKELETAL: No pain, arthritis, swelling, gout. NEUROLOGIC: No numbness, tingling, weakness or ataxia. No seizure-type activity. PSYCHIATRIC: No anxiety, depression, insomnia.  PHYSICAL EXAMINATION: VITAL SIGNS: Blood pressure (!) 149/82, pulse 100, temperature 97.7 F (36.5 C), temperature source Axillary, resp. rate 20, height '5\' 2"'$  (1.575 m), weight 41.7 kg (92 lb), SpO2 95 %.  GENERAL: 76 y.o.-year-old female patient, well-developed, well-nourished lying in the bed in no acute distress.  Pleasant and cooperative.   HEENT: Head atraumatic, normocephalic. Pupils equal, round, reactive to light and accommodation. No scleral icterus. Extraocular muscles intact. Oropharynx is clear. Mucus membranes moist. NECK: Supple, full range of motion. No JVD, no bruit heard. No cervical lymphadenopathy. CHEST: Normal breath sounds bilaterally. No wheezing, rales, rhonchi or crackles. No use of accessory muscles of respiration.  No reproducible chest wall tenderness.  CARDIOVASCULAR: S1, S2 normal. No murmurs, rubs, or gallops appreciated. Cap refill <2 seconds. ABDOMEN: Soft, nontender, nondistended. No rebound, guarding, rigidity. Normoactive bowel sounds present in all four quadrants. No organomegaly or mass. EXTREMITIES: Full range of motion. No pedal edema, cyanosis, or clubbing. NEUROLOGIC: Cranial nerves II through XII are grossly intact with no focal sensorimotor  deficit. Muscle strength 5/5 in all extremities. Sensation intact. Gait not checked. PSYCHIATRIC: The patient is alert and oriented x 3. Normal affect, mood, thought content. SKIN: Warm, dry, and intact without obvious rash, lesion, or ulcer.  LABORATORY PANEL:  CBC  Recent Labs Lab 07/30/16 2005  WBC 13.9*  HGB 13.9  HCT 42.5  PLT 236   ----------------------------------------------------------------------------------------------------------------- Chemistries  Recent Labs Lab 07/30/16 2005  NA 134*  K 4.1  CL 96*  CO2 26  GLUCOSE 99  BUN 21*  CREATININE 0.61  CALCIUM 9.1  AST 15  ALT 10*  ALKPHOS 128*  BILITOT 0.8   ------------------------------------------------------------------------------------------------------------------ Cardiac Enzymes  Recent Labs Lab 07/30/16 2005  TROPONINI 0.05*   ------------------------------------------------------------------------------------------------------------------  RADIOLOGY: Dg Chest 2 View  Result Date: 07/30/2016 CLINICAL DATA:  Shortness of breath history of melanoma EXAM: CHEST  2 VIEW COMPARISON:  07/03/2016, PET-CT 1 03/2017, chest x-ray 02/20/2015 FINDINGS: Surgical clips in the right axilla and right breast. There is a small right-sided pleural effusion. Eventrated appearance of the right diaphragm is unchanged. There is interval increase in left hilar and mediastinal adenopathy. Right hilar adenopathy appears grossly unchanged. Atherosclerosis. No pneumothorax. IMPRESSION: 1. Small right-sided pleural effusion.  No acute infiltrate 2. Increased hilar and mediastinal adenopathy is worrisome for increased metastatic disease. Electronically Signed   By: Donavan Foil M.D.   On: 07/30/2016 20:37   Ct Angio Chest Pe W  And/or Wo Contrast  Result Date: 07/30/2016 CLINICAL DATA:  Shortness of breath today. Elevated troponin. History of metastatic melanoma, stage IV. EXAM: CT ANGIOGRAPHY CHEST WITH CONTRAST TECHNIQUE:  Multidetector CT imaging of the chest was performed using the standard protocol during bolus administration of intravenous contrast. Multiplanar CT image reconstructions and MIPs were obtained to evaluate the vascular anatomy. CONTRAST:  75 mL Isovue 370 COMPARISON:  02/21/2014 FINDINGS: Cardiovascular: Satisfactory opacification of the pulmonary arteries to the segmental level. No evidence of pulmonary embolism. Normal heart size. Small pericardial effusion. Calcification of the aorta. Great vessel origins are patent. Mediastinum/Nodes: Marked mediastinal and bilateral hilar lymphadenopathy. Left aortopulmonic window lymph nodes measure up to 4.6 x 4.7 cm. Right hilar nodes measure up to 3.5 x 3.6 cm diameter. Left hilar nodes measure up to 3.6 x 5.7 cm diameter. The left hilar nodes are causing extrinsic compression of the left mainstem and proximal lobar bronchi as well as of the left main pulmonary artery. The pulmonary arteries and bronchi do appear patent but or compromise. Right hilar lymph nodes cause some compression of the superior vena cava although the vessel remains patent. Esophagus is decompressed. Lungs/Pleura: Atelectasis in the lung bases. Nodule in the left lung base measuring 1.4 x 1.6 cm. Nodule in the left upper lung measures 7 mm diameter. Nodules are likely metastatic. Small bilateral pleural effusions. No pneumothorax. Upper Abdomen: Target appearing lesions in the liver with a 3 cm diameter lesion in segment 7 and 8 2.1 cm diameter lesion in segment 4. These are consistent with metastatic lesions. Cyst in the upper pole of the right kidney is unchanged since prior study. Musculoskeletal: No focal bone lesions identified. Review of the MIP images confirms the above findings. IMPRESSION: 1. No evidence of significant pulmonary embolus. However, the left main pulmonary artery is partially effaced by left hilar lymphadenopathy. 2. Prominent mediastinal and bilateral hilar lymphadenopathy, new  since prior study, suggesting metastatic disease. Lymph nodes cause extrinsic compression of the left main pulmonary artery, left main bronchus, and superior vena cava. 3. Left lung nodules, new since prior study, likely metastatic. Small bilateral pleural effusions. 4. Liver lesions appear new since prior study and are likely metastatic. Electronically Signed   By: Lucienne Capers M.D.   On: 07/30/2016 22:21    EKG: Sinus tach at 108 bpm with normal axis and nonspecific ST-T wave changes.   Echo 07/04/16 ------------------------------------------------------------------- Study Conclusions  - Left ventricle: Wall thickness was increased in a pattern of mild   LVH. There was focal basal hypertrophy. Systolic function was   mildly to moderately reduced. The estimated ejection fraction was   in the range of 40% to 45%. Features are consistent with a   pseudonormal left ventricular filling pattern, with concomitant   abnormal relaxation and increased filling pressure (grade 2   diastolic dysfunction). - Aortic valve: Moderately calcified annulus. - Mitral valve: Mildly to moderately calcified annulus. - Pulmonary arteries: Systolic pressure was mildly increased. PA   peak pressure: 36 mm Hg (S). - Pericardium, extracardiac: A small pericardial effusion was   identified circumferential to the heart.  IMPRESSION AND PLAN:  This is a 76 y.o. 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 Acute dyspnea, atypical chest pain, gray zone troponin. Rule out ACS.  May be related to pulmonary artery compression by lymphadenopathy / advancing disease. - Admit to observation with telemetry monitoring. - O2  and med neb therapy as needed - Trend troponins, check lipids and TSH. - Morphine, nitro, aspirin ordered.   - Cardiology consult requested.  - Consider palliative care.   2. History of HTN - Continue  Lotrel  3. History of hypothyroidism - Continue Synthroid  4. History of malignant melanoma with mets - Continue decadron, antiemetics, Protonix  Admission status: Observation, telemetry Diet/Nutrition: Heart healthy, NPO after midnight Fluids: HL DVT Px: Lovenox, SCDs and early ambulation Code Status: Full Disposition Planning: To home in <24 hours All the records are reviewed and case discussed with ED provider. Management plans discussed with the patient and/or family who express understanding and agree with plan of care.   TOTAL TIME TAKING CARE OF THIS PATIENT: 60 minutes.   Semya Klinke D.O. on 07/30/2016 at 10:49 PM Between 7am to 6pm - Pager - [phone] After 6pm go to [email or web address] - Proofreader [facility] Office [phone] CC: Primary care physician; [name], MD     Note: This dictation was prepared with Dragon dictation along with smaller phrase technology. Any transcriptional errors that result from this process are unintentional.

## 2016-07-30 NOTE — Telephone Encounter (Signed)
Patient's daughter Gerre Couch calling to request a refill on lovenox 40 mg q 12 hours. Okay to refill? Next regularly scheduled visit 08/03/16

## 2016-07-30 NOTE — Telephone Encounter (Signed)
Please refill.

## 2016-07-30 NOTE — ED Triage Notes (Addendum)
Pt to triage via wheelchair.  Pt reports sob today.  No chest pain.   Pt has metastatic melanoma stage 4 per daughter.  Pt alert.  Pt reports feeling weak.   Pt had first treatment this week at [facility] long.

## 2016-07-30 NOTE — ED Notes (Signed)
Pt in CT.

## 2016-07-30 NOTE — ED Provider Notes (Signed)
[facility] Emergency Department Provider Note   ____________________________________________    I have reviewed the triage vital signs and the nursing notes.   HISTORY  Chief Complaint Shortness of Breath     HPI Victoria Castro is a 76 y.o. female with a history of stage IV metastatic melanoma who presents with complaints of shortness of breath. She reports onset of shortness of breath today. Denies fevers or chills. She does take Lovenox daily. No calf pain or swelling. No chest pain no pleurisy. She reports she has never had shortness of breath like this before. No cough. She does not smoke.   Past Medical History:  Diagnosis Date  . HTN (hypertension)   . Melanoma ([employer])   . Protein calorie malnutrition [facility])     Patient Active Problem List   Diagnosis Date Noted  . Goals of care, counseling/discussion 07/20/2016  . Protein-calorie malnutrition, severe 07/03/2016  . 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 Hypokalemia 07/02/2016  . Hypothyroidism, acquired 07/02/2016  . Essential hypertension 07/02/2016  . Nausea without vomiting 06/25/2016  . Metastatic melanoma ([place]) 06/25/2016  . Loss of weight 06/25/2016  . Malnutrition, calorie ([place]) 06/25/2016  . Melanoma of skin ([place]) 12/12/2015  . Cancer of right lung ([place]) 02/21/2014    Past Surgical History:  Procedure Laterality Date  . BREAST LUMPECTOMY     right  . MELANOMA EXCISION    . THORACOTOMY    . TONSILLECTOMY      Prior to Admission medications   Medication Sig Start Date End Date Taking? Authorizing Provider  amLODipine-benazepril (LOTREL) 5-20 MG capsule Take 1 capsule by mouth daily. Hold for SBP less than 130 07/03/16   Belkys A Regalado, MD  dexamethasone (DECADRON) 1 MG tablet Take 1 tablet (1 mg total) by mouth daily with breakfast. 07/20/16   Wyatt Portela, MD  enoxaparin (LOVENOX) 40 MG/0.4ML injection Inject 0.4 mLs (40 mg total) into the skin every 12 (twelve)  hours. 07/30/16   Wyatt Portela, MD  levothyroxine (SYNTHROID, LEVOTHROID) 50 MCG tablet Take 50 mcg by mouth daily before breakfast.    Historical Provider, MD  mirtazapine (REMERON) 15 MG tablet Take 1 tablet (15 mg total) by mouth at bedtime. 05/29/16   Wyatt Portela, MD  ondansetron (ZOFRAN ODT) 8 MG disintegrating tablet Take 1 tablet (8 mg total) by mouth every 8 (eight) hours as needed for nausea or vomiting. 06/11/16   Wyatt Portela, MD  pantoprazole (PROTONIX) 40 MG tablet Take 1 tablet (40 mg total) by mouth 2 (two) times daily. 06/25/16   Laban Emperor Zehr, PA-C  prochlorperazine (COMPAZINE) 10 MG tablet Take 1 tablet (10 mg total) by mouth every 6 (six) hours as needed for nausea or vomiting. 06/22/16   Wyatt Portela, MD  promethazine (PHENERGAN) 25 MG tablet Take 25 mg by mouth every 6 (six) hours as needed for nausea or vomiting.    Historical Provider, MD     Allergies Patient has no known allergies.  Family History  Problem Relation Age of Onset  . Heart disease Father   . Esophageal cancer Neg Hx   . Colon cancer Neg Hx   . Colon polyps Neg Hx   . Stomach cancer Neg Hx   . Rectal cancer Neg Hx     Social History Social History  Substance Use Topics  . Smoking status: Former Smoker    Packs/day: 0.50    Years: 18.00    Types:  Cigarettes    Start date: 09/21/1956    Quit date: 02/21/1973  . Smokeless tobacco: Never Used     Comment: quit 40 years ago  . Alcohol use No    Review of Systems  Constitutional: No fever/chills  Cardiovascular: Denies chest pain. Respiratory: As above Gastrointestinal: No abdominal pain.  No nausea, no vomiting.    Musculoskeletal: Negative for back pain. Skin: Negative for rash. Neurological: Negative for headaches  10-point ROS otherwise negative.  ____________________________________________   PHYSICAL EXAM:  VITAL SIGNS: ED Triage Vitals  Enc Vitals Group     BP 07/30/16 1922 (!) 138/92     Pulse Rate 07/30/16 1922 (!)  107     Resp 07/30/16 1922 20     Temp 07/30/16 1925 97.7 F (36.5 C)     Temp Source 07/30/16 1922 Oral     SpO2 07/30/16 1922 99 %     Weight 07/30/16 1923 92 lb (41.7 kg)     Height 07/30/16 1923 '5\' 2"'$  (1.575 m)     Head Circumference --      Peak Flow --      Pain Score --      Pain Loc --      Pain Edu? --      Excl. in [place]? --     Constitutional: Alert and oriented. No acute distress. Pleasant and interactive  Nose: No congestion/rhinnorhea. Mouth/Throat: Mucous membranes are moist.    Cardiovascular: Tachycardia, regular rhythm. Grossly normal heart sounds.  Good peripheral circulation. Respiratory: Normal respiratory effort.  No retractions. Lungs CTAB. Gastrointestinal: Soft and nontender. No distention.  No CVA tenderness. Genitourinary: deferred Musculoskeletal: No lower extremity tenderness nor edema.  Warm and well perfused Neurologic:  Normal speech and language. No gross focal neurologic deficits are appreciated.  Skin:  Skin is warm, dry and intact. No rash noted. Psychiatric: Mood and affect are normal. Speech and behavior are normal.  ____________________________________________   LABS (all labs ordered are listed, but only abnormal results are displayed)  Labs Reviewed  CBC WITH DIFFERENTIAL/PLATELET - Abnormal; Notable for the following:       Result Value   WBC 13.9 (*)    RDW 16.8 (*)    Neutro Abs 11.9 (*)    Lymphs Abs 0.9 (*)    Monocytes Absolute 1.0 (*)    All other components within normal limits  COMPREHENSIVE METABOLIC PANEL - Abnormal; Notable for the following:    Sodium 134 (*)    Chloride 96 (*)    BUN 21 (*)    Total Protein 6.1 (*)    Albumin 2.6 (*)    ALT 10 (*)    Alkaline Phosphatase 128 (*)    All other components within normal limits  TROPONIN I - Abnormal; Notable for the following:    Troponin I 0.05 (*)    All other components within normal limits  LACTIC ACID, PLASMA  LACTIC ACID, PLASMA    ____________________________________________  EKG  ED ECG REPORT I, Lavonia Drafts, the attending physician, personally viewed and interpreted this ECG.  Date: 07/30/2016 EKG Time: 7:29 PM Rate: 108 Rhythm: Sinus tachycardia QRS Axis: normal Intervals: normal ST/T Wave abnormalities: Chronic T wave abnormality's Conduction Disturbances: none Narrative Interpretation: unremarkable  ____________________________________________  RADIOLOGY  No pneumonia on chest x-ray CTA negative for PE ____________________________________________   PROCEDURES  Procedure(s) performed: No    Critical Care performed: No ____________________________________________   INITIAL IMPRESSION / ASSESSMENT AND PLAN / ED COURSE  Pertinent  labs & imaging results that were available during my care of the patient were reviewed by me and considered in my medical decision making (see chart for details).  Patient presents with shortness of breath, mild tachycardia. She has been undergoing therapy for melanoma, pneumonitis/pneumonia or high on the differential. Doubt PE given that she takes Lovenox and has not missed any doses. We will check labs, x-ray and monitor carefully    Chest x-ray shows worsening adenopathy, CT scan performed after discussions with patient and daughter. CT demonstrates compression of pulmonary artery and bronchus and SVC. Unclear whether this is causing her shortness of breath. Possibility of an nSTEMI given that this began with exertion while walking stairs. Patient has opted to stay in the hospital overnight for trending of troponins  ____________________________________________   FINAL CLINICAL IMPRESSION(S) / ED DIAGNOSES  Final diagnoses:  SOB (shortness of breath)      NEW MEDICATIONS STARTED DURING THIS VISIT:  New Prescriptions   No medications on file     Note:  This document was prepared using Dragon voice recognition software and may include  unintentional dictation errors.    Lavonia Drafts, MD 07/30/16 2251

## 2016-07-31 LAB — TROPONIN I
Troponin I: 0.04 ng/mL (ref <0.03)
Troponin I: 0.03 ng/mL (ref <0.03)

## 2016-07-31 LAB — LIPID PANEL
CHOL/HDL RATIO: 7.2 ratio
Cholesterol: 158 mg/dL (ref 0–200)
HDL: 22 mg/dL — AB (ref >40)
LDL CALC: 86 mg/dL (ref 0–99)
TRIGLYCERIDES: 250 mg/dL — AB (ref <150)
VLDL: 50 mg/dL — AB (ref 0–40)

## 2016-07-31 LAB — GUARDANT 360

## 2016-07-31 LAB — TSH: TSH: 4.273 u[IU]/mL (ref 0.350–4.500)

## 2016-07-31 MED ORDER — BENAZEPRIL HCL 20 MG PO TABS
20.0000 mg | ORAL_TABLET | Freq: Every day | ORAL | Status: DC
  Administered 2016-07-31: 20 mg via ORAL
  Filled 2016-07-31 (×2): qty 1

## 2016-07-31 MED ORDER — ENOXAPARIN SODIUM 40 MG/0.4ML ~~LOC~~ SOLN
40.0000 mg | Freq: Two times a day (BID) | SUBCUTANEOUS | Status: DC
  Administered 2016-07-31: 40 mg via SUBCUTANEOUS
  Filled 2016-07-31: qty 0.4

## 2016-07-31 MED ORDER — MIRTAZAPINE 15 MG PO TABS
15.0000 mg | ORAL_TABLET | Freq: Every day | ORAL | Status: DC
  Administered 2016-07-31: 15 mg via ORAL
  Filled 2016-07-31: qty 1

## 2016-07-31 MED ORDER — ALPRAZOLAM 0.25 MG PO TABS: 0.2500 mg | ORAL_TABLET | Freq: Two times a day (BID) | ORAL | Status: DC | PRN

## 2016-07-31 MED ORDER — AMLODIPINE BESYLATE 5 MG PO TABS
5.0000 mg | ORAL_TABLET | Freq: Every day | ORAL | Status: DC
  Administered 2016-07-31: 5 mg via ORAL
  Filled 2016-07-31: qty 1

## 2016-07-31 MED ORDER — AMLODIPINE BESY-BENAZEPRIL HCL 5-20 MG PO CAPS: 1.0000 | ORAL_CAPSULE | Freq: Every day | ORAL | Status: DC

## 2016-07-31 MED ORDER — LEVOTHYROXINE SODIUM 50 MCG PO TABS
50.0000 ug | ORAL_TABLET | Freq: Every day | ORAL | Status: DC
  Administered 2016-07-31: 50 ug via ORAL
  Filled 2016-07-31: qty 1

## 2016-07-31 MED ORDER — GUAIFENESIN-DM 100-10 MG/5ML PO SYRP
5.0000 mL | ORAL_SOLUTION | ORAL | Status: DC | PRN
  Administered 2016-07-31: 5 mL via ORAL
  Filled 2016-07-31: qty 5

## 2016-07-31 MED ORDER — ACETAMINOPHEN 325 MG PO TABS: 650.0000 mg | ORAL_TABLET | ORAL | Status: DC | PRN

## 2016-07-31 MED ORDER — IPRATROPIUM-ALBUTEROL 0.5-2.5 (3) MG/3ML IN SOLN: 3.0000 mL | Freq: Four times a day (QID) | RESPIRATORY_TRACT | Status: DC | PRN

## 2016-07-31 MED ORDER — ZOLPIDEM TARTRATE 5 MG PO TABS: 5.0000 mg | ORAL_TABLET | Freq: Every evening | ORAL | Status: DC | PRN

## 2016-07-31 MED ORDER — PROMETHAZINE HCL 25 MG PO TABS
25.0000 mg | ORAL_TABLET | Freq: Four times a day (QID) | ORAL | Status: DC | PRN
  Administered 2016-07-31: 25 mg via ORAL
  Filled 2016-07-31: qty 1

## 2016-07-31 MED ORDER — MORPHINE SULFATE (PF) 2 MG/ML IV SOLN: 2.0000 mg | INTRAVENOUS | Status: DC | PRN

## 2016-07-31 MED ORDER — ENOXAPARIN SODIUM 40 MG/0.4ML ~~LOC~~ SOLN: 40.0000 mg | SUBCUTANEOUS | Status: DC

## 2016-07-31 MED ORDER — GI COCKTAIL ~~LOC~~
30.0000 mL | Freq: Four times a day (QID) | ORAL | Status: DC | PRN
  Filled 2016-07-31: qty 30

## 2016-07-31 MED ORDER — DEXAMETHASONE 0.5 MG PO TABS
1.0000 mg | ORAL_TABLET | Freq: Every day | ORAL | Status: DC
  Administered 2016-07-31: 1 mg via ORAL
  Filled 2016-07-31: qty 2

## 2016-07-31 MED ORDER — PANTOPRAZOLE SODIUM 40 MG PO TBEC
40.0000 mg | DELAYED_RELEASE_TABLET | Freq: Two times a day (BID) | ORAL | Status: DC
  Administered 2016-07-31: 40 mg via ORAL
  Filled 2016-07-31 (×2): qty 1

## 2016-07-31 MED ORDER — ONDANSETRON 8 MG PO TBDP
8.0000 mg | ORAL_TABLET | Freq: Three times a day (TID) | ORAL | Status: DC | PRN
  Filled 2016-07-31: qty 1

## 2016-07-31 MED ORDER — ONDANSETRON HCL 4 MG/2ML IJ SOLN: 4.0000 mg | Freq: Four times a day (QID) | INTRAMUSCULAR | Status: DC | PRN

## 2016-07-31 MED ORDER — PROCHLORPERAZINE MALEATE 10 MG PO TABS
10.0000 mg | ORAL_TABLET | Freq: Four times a day (QID) | ORAL | Status: DC | PRN
  Filled 2016-07-31: qty 1

## 2016-07-31 NOTE — Care Management Obs Status (Signed)
[place] NOTIFICATION   Patient Details  Name: Victoria Castro MRN: [id number] Date of Birth: July 09, 1940   Medicare Observation Status Notification Given:  No  < 24hr    Katrina Stack, RN 07/31/2016, 3:51 PM

## 2016-07-31 NOTE — Progress Notes (Signed)
Patient has a hx of embolic stroke, metastatic melanoma, lymphadenaopathy who takes therapeutic lovenox for long term tx per oncologist.  Lovenox order changed back to pt home dose '40mg'$  q 12 hrs  Victoria Castro Victoria Castro, Pharm.Victoria, BCPS Clinical Pharmacist

## 2016-07-31 NOTE — Discharge Summary (Signed)
[place] [facility] [place] NAME: Victoria Castro    MR#:  [id number]  DATE OF BIRTH:  Aug 26, 1940  DATE OF ADMISSION:  07/30/2016 ADMITTING PHYSICIAN: [name], DO  DATE OF DISCHARGE:07/31/16  PRIMARY CARE PHYSICIAN: [name], MD    ADMISSION DIAGNOSIS:  SOB (shortness of breath) [R06.02]  DISCHARGE DIAGNOSIS:  Acute SOB-resolved Metastatic Melanoma (pt undergoing Palliative chemo)  SECONDARY DIAGNOSIS:   Past Medical History:  Diagnosis Date  . HTN (hypertension)   . Melanoma ([place])   . Protein calorie malnutrition [facility])     HOSPITAL COURSE:  76 y.o. 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 Acute dyspnea, atypical chest pain, gray zone troponin. Ruled out ACS.  May be related to pulmonary artery compression by lymphadenopathy / advancing disease. - O2 sats>92% and neb therapy as needed - Morphine, nitro, aspirin ordered.   -echo in feb showed EF 45% -no cp or any other cardiac issues -pt wants to hold off on any further testing-agree -CT chest neg PE , shows impressive Lymphadenopathy  2. History of HTN - Continue Lotrel  3. History of hypothyroidism - Continue Synthroid  4. History of malignant melanoma with mets - f/u oncology on scheduled appt on Monday  D/w pt and dter  CONSULTS OBTAINED:    DRUG ALLERGIES:  No Known Allergies  DISCHARGE MEDICATIONS:   Current Discharge Medication List    CONTINUE these medications which have NOT CHANGED   Details  amLODipine-benazepril (LOTREL) 5-20 MG capsule Take 1 capsule by mouth daily. Hold for SBP less than 130 Qty: 10 capsule, Refills: 0   Associated Diagnoses: Breast cancer ([place]); Osteoporosis, post-menopausal; Lung cancer, upper lobe ([facility])    enoxaparin (LOVENOX) 40 MG/0.4ML injection Inject 0.4 mLs (40 mg total) into the skin every 12 (twelve)  hours. Qty: 60 Syringe, Refills: 0    levothyroxine (SYNTHROID, LEVOTHROID) 50 MCG tablet Take 50 mcg by mouth daily before breakfast.    mirtazapine (REMERON) 15 MG tablet Take 1 tablet (15 mg total) by mouth at bedtime. Qty: 30 tablet, Refills: 1    ondansetron (ZOFRAN ODT) 8 MG disintegrating tablet Take 1 tablet (8 mg total) by mouth every 8 (eight) hours as needed for nausea or vomiting. Qty: 30 tablet, Refills: 1    prochlorperazine (COMPAZINE) 10 MG tablet Take 1 tablet (10 mg total) by mouth every 6 (six) hours as needed for nausea or vomiting. Qty: 30 tablet, Refills: 1    promethazine (PHENERGAN) 25 MG tablet Take 25 mg by mouth every 6 (six) hours as needed for nausea or vomiting.        If you experience worsening of your admission symptoms, develop shortness of breath, life threatening emergency, suicidal or homicidal thoughts you must seek medical attention immediately by calling 911 or calling your MD immediately  if symptoms less severe.  You Must read complete instructions/literature along with all the possible adverse reactions/side effects for all the Medicines you take and that have been prescribed to you. Take any new Medicines after you have completely understood and accept all the possible adverse reactions/side effects.   Please note  You were cared for by a hospitalist during your hospital stay. If you have any questions about your discharge medications or the care you received while you were in the hospital after you are discharged, you can call the unit and asked to  speak with the hospitalist on call if the hospitalist that took care of you is not available. Once you are discharged, your primary care physician will handle any further medical issues. Please note that NO REFILLS for any discharge medications will be authorized once you are discharged, as it is imperative that you return to your primary care physician (or establish a relationship with a primary care  physician if you do not have one) for your aftercare needs so that they can reassess your need for medications and monitor your lab values. Today   SUBJECTIVE   Now new complaints  VITAL SIGNS:  Blood pressure (!) 153/74, pulse 97, temperature 98.3 F (36.8 C), temperature source Oral, resp. rate 15, height '5\' 2"'$  (1.575 m), weight 42.3 kg (93 lb 4.8 oz), SpO2 96 %.  I/O:   Intake/Output Summary (Last 24 hours) at 07/31/16 1210 Last data filed at 07/31/16 0900  Gross per 24 hour  Intake              240 ml  Output                0 ml  Net              240 ml    PHYSICAL EXAMINATION:  76 y.o.-year-old patient lying in the bed with no acute distress.  EYES: Pupils equal, round, reactive to light and accommodation. No scleral icterus. Extraocular muscles intact.  HEENT: Head atraumatic, normocephalic. Oropharynx and nasopharynx clear.  NECK:  Supple, no jugular venous distention. No thyroid enlargement, no tenderness.  LUNGS: Normal breath sounds bilaterally, no wheezing, rales,rhonchi or crepitation. No use of accessory muscles of respiration.  CARDIOVASCULAR: S1, S2 normal. No murmurs, rubs, or gallops.  ABDOMEN: Soft, non-tender, non-distended. Bowel sounds present. No organomegaly or mass.  EXTREMITIES: No pedal edema, cyanosis, or clubbing.  NEUROLOGIC: Cranial nerves II through XII are intact. Muscle strength 5/5 in all extremities. Sensation intact. Gait not checked.  PSYCHIATRIC: The patient is alert and oriented x 3.  SKIN: No obvious rash, lesion, or ulcer.   DATA REVIEW:   CBC   Recent Labs Lab 07/30/16 2005  WBC 13.9*  HGB 13.9  HCT 42.5  PLT 236    Chemistries   Recent Labs Lab 07/30/16 2005  NA 134*  K 4.1  CL 96*  CO2 26  GLUCOSE 99  BUN 21*  CREATININE 0.61  CALCIUM 9.1  AST 15  ALT 10*  ALKPHOS 128*  BILITOT 0.8    Microbiology Results   No results found for this or any previous visit (from the past 240 hour(s)).  RADIOLOGY:   Dg Chest 2 View  Result Date: 07/30/2016 CLINICAL DATA:  Shortness of breath history of melanoma EXAM: CHEST  2 VIEW COMPARISON:  07/03/2016, PET-CT 1 03/2017, chest x-ray 02/20/2015 FINDINGS: Surgical clips in the right axilla and right breast. There is a small right-sided pleural effusion. Eventrated appearance of the right diaphragm is unchanged. There is interval increase in left hilar and mediastinal adenopathy. Right hilar adenopathy appears grossly unchanged. Atherosclerosis. No pneumothorax. IMPRESSION: 1. Small right-sided pleural effusion.  No acute infiltrate 2. Increased hilar and mediastinal adenopathy is worrisome for increased metastatic disease. Electronically Signed   By: Donavan Foil M.D.   On: 07/30/2016 20:37   Ct Angio Chest Pe W And/or Wo Contrast  Result Date: 07/30/2016 CLINICAL DATA:  Shortness of breath today. Elevated troponin. History of metastatic melanoma, stage IV. EXAM: CT ANGIOGRAPHY CHEST WITH  CONTRAST TECHNIQUE: Multidetector CT imaging of the chest was performed using the standard protocol during bolus administration of intravenous contrast. Multiplanar CT image reconstructions and MIPs were obtained to evaluate the vascular anatomy. CONTRAST:  75 mL Isovue 370 COMPARISON:  02/21/2014 FINDINGS: Cardiovascular: Satisfactory opacification of the pulmonary arteries to the segmental level. No evidence of pulmonary embolism. Normal heart size. Small pericardial effusion. Calcification of the aorta. Great vessel origins are patent. Mediastinum/Nodes: Marked mediastinal and bilateral hilar lymphadenopathy. Left aortopulmonic window lymph nodes measure up to 4.6 x 4.7 cm. Right hilar nodes measure up to 3.5 x 3.6 cm diameter. Left hilar nodes measure up to 3.6 x 5.7 cm diameter. The left hilar nodes are causing extrinsic compression of the left mainstem and proximal lobar bronchi as well as of the left main pulmonary artery. The pulmonary arteries and bronchi do appear patent but  or compromise. Right hilar lymph nodes cause some compression of the superior vena cava although the vessel remains patent. Esophagus is decompressed. Lungs/Pleura: Atelectasis in the lung bases. Nodule in the left lung base measuring 1.4 x 1.6 cm. Nodule in the left upper lung measures 7 mm diameter. Nodules are likely metastatic. Small bilateral pleural effusions. No pneumothorax. Upper Abdomen: Target appearing lesions in the liver with a 3 cm diameter lesion in segment 7 and 8 2.1 cm diameter lesion in segment 4. These are consistent with metastatic lesions. Cyst in the upper pole of the right kidney is unchanged since prior study. Musculoskeletal: No focal bone lesions identified. Review of the MIP images confirms the above findings. IMPRESSION: 1. No evidence of significant pulmonary embolus. However, the left main pulmonary artery is partially effaced by left hilar lymphadenopathy. 2. Prominent mediastinal and bilateral hilar lymphadenopathy, new since prior study, suggesting metastatic disease. Lymph nodes cause extrinsic compression of the left main pulmonary artery, left main bronchus, and superior vena cava. 3. Left lung nodules, new since prior study, likely metastatic. Small bilateral pleural effusions. 4. Liver lesions appear new since prior study and are likely metastatic. Electronically Signed   By: Lucienne Capers M.D.   On: 07/30/2016 22:21     Management plans discussed with the patient, family and they are in agreement.  CODE STATUS:     Code Status Orders        Start     Ordered   07/31/16 0057  Full code  Continuous     07/31/16 0056    Code Status History    Date Active Date Inactive Code Status Order ID Comments User Context   07/03/2016  3:46 AM 07/03/2016  8:59 PM Full Code [id number]  Edwin Dada, MD Inpatient    Advance Directive Documentation     Most Recent Value  Type of Advance Directive  Healthcare Power of Attorney  Pre-existing out of facility DNR  order (yellow form or pink MOST form)  -  "MOST" Form in Place?  -      TOTAL TIME TAKING CARE OF THIS PATIENT: *40* minutes.    Kennedi Lizardo M.D on 07/31/2016 at 12:10 PM  Between 7am to 6pm - Pager - [phone] After 6pm go to [email or web address] - password [email or web address] [facility]  Office  [phone]  CC: Primary care physician; [name], MD

## 2016-07-31 NOTE — Progress Notes (Signed)
Pt discharged to home via wc.  Instructions  given to pt.  Questions answered.  No distress.  

## 2016-07-31 NOTE — Progress Notes (Signed)
Patient admitted for CP w/ trops 0.03 - 0.04 was on Lovenox 40 mg bid. Pt. Weighs 42.3 kg Called overnight MD to verify if this was suppose to be full-dose lovenox. Confirmed that this is only suppose to be DVT prophylaxis.  Will dose adjust Lovenox to DVT prophylaxis dose: Lovenox 40 mg daily.  Tobie Lords, PharmD, BCPS Clinical Pharmacist 07/31/2016

## 2016-08-03 ENCOUNTER — Other Ambulatory Visit (HOSPITAL_BASED_OUTPATIENT_CLINIC_OR_DEPARTMENT_OTHER): Payer: Medicare Other

## 2016-08-03 ENCOUNTER — Telehealth: Payer: Self-pay | Admitting: Oncology

## 2016-08-03 ENCOUNTER — Ambulatory Visit (HOSPITAL_BASED_OUTPATIENT_CLINIC_OR_DEPARTMENT_OTHER): Payer: Medicare Other | Admitting: Oncology

## 2016-08-03 VITALS — BP 132/78 | HR 11 | Resp 17 | Ht 62.0 in | Wt 93.3 lb

## 2016-08-03 DIAGNOSIS — C3401 Malignant neoplasm of right main bronchus: Secondary | ICD-10-CM

## 2016-08-03 DIAGNOSIS — C430 Malignant melanoma of lip: Secondary | ICD-10-CM | POA: Diagnosis not present

## 2016-08-03 DIAGNOSIS — C770 Secondary and unspecified malignant neoplasm of lymph nodes of head, face and neck: Secondary | ICD-10-CM

## 2016-08-03 DIAGNOSIS — C7800 Secondary malignant neoplasm of unspecified lung: Secondary | ICD-10-CM | POA: Diagnosis not present

## 2016-08-03 DIAGNOSIS — C439 Malignant melanoma of skin, unspecified: Secondary | ICD-10-CM

## 2016-08-03 DIAGNOSIS — C787 Secondary malignant neoplasm of liver and intrahepatic bile duct: Secondary | ICD-10-CM

## 2016-08-03 DIAGNOSIS — C799 Secondary malignant neoplasm of unspecified site: Secondary | ICD-10-CM

## 2016-08-03 DIAGNOSIS — R112 Nausea with vomiting, unspecified: Secondary | ICD-10-CM

## 2016-08-03 LAB — COMPREHENSIVE METABOLIC PANEL
ALBUMIN: 2.4 g/dL — AB (ref 3.5–5.0)
ALK PHOS: 161 U/L — AB (ref 40–150)
ALT: 11 U/L (ref 0–55)
AST: 13 U/L (ref 5–34)
Anion Gap: 13 mEq/L — ABNORMAL HIGH (ref 3–11)
BILIRUBIN TOTAL: 0.36 mg/dL (ref 0.20–1.20)
BUN: 12.6 mg/dL (ref 7.0–26.0)
CALCIUM: 9.7 mg/dL (ref 8.4–10.4)
CO2: 26 mEq/L (ref 22–29)
Chloride: 101 mEq/L (ref 98–109)
Creatinine: 0.6 mg/dL (ref 0.6–1.1)
EGFR: 87 mL/min/{1.73_m2} — ABNORMAL LOW (ref >90)
GLUCOSE: 164 mg/dL — AB (ref 70–140)
Potassium: 4.2 mEq/L (ref 3.5–5.1)
SODIUM: 140 meq/L (ref 136–145)
TOTAL PROTEIN: 6.2 g/dL — AB (ref 6.4–8.3)

## 2016-08-03 LAB — CBC WITH DIFFERENTIAL/PLATELET
BASO%: 0.2 % (ref 0.0–2.0)
Basophils Absolute: 0 10*3/uL (ref 0.0–0.1)
EOS ABS: 0 10*3/uL (ref 0.0–0.5)
EOS%: 0.1 % (ref 0.0–7.0)
HEMATOCRIT: 45.3 % (ref 34.8–46.6)
HGB: 14.7 g/dL (ref 11.6–15.9)
LYMPH#: 0.9 10*3/uL (ref 0.9–3.3)
LYMPH%: 7.5 % — ABNORMAL LOW (ref 14.0–49.7)
MCH: 26.8 pg (ref 25.1–34.0)
MCHC: 32.5 g/dL (ref 31.5–36.0)
MCV: 82.5 fL (ref 79.5–101.0)
MONO#: 0.9 10*3/uL (ref 0.1–0.9)
MONO%: 7.2 % (ref 0.0–14.0)
NEUT%: 85 % — ABNORMAL HIGH (ref 38.4–76.8)
NEUTROS ABS: 10.3 10*3/uL — AB (ref 1.5–6.5)
Platelets: 250 10*3/uL (ref 145–400)
RBC: 5.49 10*6/uL — ABNORMAL HIGH (ref 3.70–5.45)
RDW: 17.3 % — AB (ref 11.2–14.5)
WBC: 12.1 10*3/uL — AB (ref 3.9–10.3)

## 2016-08-03 MED ORDER — SODIUM CHLORIDE 0.9 % IV SOLN
Freq: Once | INTRAVENOUS | Status: AC
  Administered 2016-08-03: 12:00:00 via INTRAVENOUS

## 2016-08-03 MED ORDER — SODIUM CHLORIDE 0.9 % IV SOLN: Freq: Once | INTRAVENOUS | Status: DC

## 2016-08-03 NOTE — Addendum Note (Signed)
Addended by: Randolm Idol on: 08/03/2016 12:51 PM   Modules accepted: Orders

## 2016-08-03 NOTE — Telephone Encounter (Signed)
Appointments scheduled per 3.19.18 LOS. Patient given AVS report and calendars with future scheduled appointments.

## 2016-08-03 NOTE — Progress Notes (Signed)
Hematology and Oncology Follow Up Visit  Victoria Castro [id number] 11-18-1940 76 y.o. 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 She developed subsequently cervical adenopathy in June 2017. Her final pathological staging was IIIB (TisN2aM0).  She was found to have metastatic melanoma in January 2018. The BRAF wild type.    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 She underwent Mohs procedure in February 2017 which did not show any residual malignancy at that time.  She subsequently started developing a tender mass in her right submandibular region which did not improve with antibiotics. Evaluate her aspiration done on 09/16/2015 showed atypical cells suspicious for malignancy.  PET scan showed right-sided lymphadenopathy at the level of IIa. She subsequently underwent a repeat biopsy on 10/02/2015 which confirmed the presence of metastatic melanoma. She is status post neck dissection performed on 10/22/2015 which showed 2 out of 7 lymph nodes involved with metastatic melanoma. Adjuvant ipilimumab at reduced dose of 3 mg/kg every 3 weeks for a total of 4 doses. She is supposed 4 treatments completed in 03/17/2016. She is status post liver biopsy on 06/11/2016. The biopsy confirmed the presence of metastatic melanoma with BRAF testing still pending.  Current therapy: Pembrolizumab 200 mg every 3 weeks first dose given on 07/28/2016.  Interim History: Victoria Castro presents today for a follow-up visit. Since the last visit, she received the first dose of Pembrolizumab and tolerated it well. She was hospitalized briefly for dyspnea on exertion and her workup did not reveal any acute process. CT scan of the chest was personally reviewed and did not show any evidence of pulmonary embolism or  pneumonitis.   She is able to eat and drink better at this time. She has maintained her weight and continues to receive physical therapy. Her ambulation is still limited but has not reported any falls or syncope. She denied any dysphagia or difficulty swallowing. Her performance status and quality of life slightly improved at this time. She denied any falls or syncope. She does report some mild dyspnea but none at rest.   She does not report any neurological symptoms. She denied any syncope or seizures. She denied any fevers or chills or sweats. She does not report any wheezing or hemoptysis. She does not report any chest pain, palpitation, orthopnea or leg edema. She does not report any frequency urgency or hesitancy. She does not report any skeletal complaints. Remaining review of systems unremarkable.   Medications: I have reviewed the patient's current medications.  Current Outpatient Prescriptions  Medication Sig Dispense Refill  . amLODipine-benazepril (LOTREL) 5-20 MG capsule Take 1 capsule by mouth daily. Hold for SBP less than 130 10 capsule 0  . enoxaparin (LOVENOX) 40 MG/0.4ML injection Inject 0.4 mLs (40 mg total) into the skin every 12 (twelve) hours. 60 Syringe 0  . levothyroxine (SYNTHROID, LEVOTHROID) 50 MCG tablet Take 50 mcg by mouth daily before breakfast.    . mirtazapine (REMERON) 15 MG tablet Take 1 tablet (15 mg total) by mouth at bedtime. 30 tablet 1  . ondansetron (ZOFRAN ODT) 8 MG disintegrating tablet Take 1 tablet (8 mg total) by mouth every 8 (eight) hours as needed for nausea or vomiting. 30 tablet 1  . prochlorperazine (COMPAZINE) 10 MG tablet Take 1 tablet (10 mg total) by mouth every 6 (six) hours  as needed for nausea or vomiting. 30 tablet 1  . promethazine (PHENERGAN) 25 MG tablet Take 25 mg by mouth every 6 (six) hours as needed for nausea or vomiting.     No current facility-administered medications for this visit.      Allergies: No Known Allergies  Past  Medical History, Surgical history, Social history, and Family History were reviewed and updated.   Physical Exam: Blood pressure 132/78, pulse (!) 11, resp. rate 17, height 5' 2"  (1.575 m), weight 93 lb 4.8 oz (42.3 kg), SpO2 93 %. ECOG: 1 General appearance: Frail woman appeared without distress. Head: Normocephalic, without obvious abnormality. No oral ulcers or lesions. Neck: no adenopathy Lymph nodes: Cervical, supraclavicular, and axillary nodes normal. Heart:regular rate and rhythm, S1, S2 normal, no murmur, click, rub or gallop Lung:chest clear, no wheezing, rales, normal symmetric air entry. No dullness to percussion. Abdomin: soft, non-tender, without masses or organomegaly no rebound or guarding. EXT:no erythema, induration, or nodules no edema noted. Neurological examination: No deficits noted.  Lab Results: Lab Results  Component Value Date   WBC 12.1 (H) 08/03/2016   HGB 14.7 08/03/2016   HCT 45.3 08/03/2016   MCV 82.5 08/03/2016   PLT 250 08/03/2016     Chemistry      Component Value Date/Time   NA 134 (L) 07/30/2016 2005   NA 139 07/28/2016 0913   K 4.1 07/30/2016 2005   K 4.4 07/28/2016 0913   CL 96 (L) 07/30/2016 2005   CL 100 02/21/2014 0902   CO2 26 07/30/2016 2005   CO2 27 07/28/2016 0913   BUN 21 (H) 07/30/2016 2005   BUN 10.7 07/28/2016 0913   CREATININE 0.61 07/30/2016 2005   CREATININE 0.6 07/28/2016 0913      Component Value Date/Time   CALCIUM 9.1 07/30/2016 2005   CALCIUM 10.3 07/28/2016 0913   ALKPHOS 128 (H) 07/30/2016 2005   ALKPHOS 158 (H) 07/28/2016 0913   AST 15 07/30/2016 2005   AST 13 07/28/2016 0913   ALT 10 (L) 07/30/2016 2005   ALT 9 07/28/2016 0913   BILITOT 0.8 07/30/2016 2005   BILITOT 0.53 07/28/2016 0913      Impression and Plan:  76 year old woman with the following issues:  1. 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 She was initially found to have a lesion on the right upper lip and  subsequently developed cervical adenopathy with the final pathological staging as IIIB (TisN2aM0). She is status post Mohs procedure followed by neck dissection completed in June 2017.  She is S/P adjuvant therapy utilizing ipilimumab at 3 mg/kg every 3 weeks. She is status post 4 treatments completed without acute toxicities. She did develop delayed toxicity including hypothyroidism which she is currently being treated for.  PET CT scan obtained on 05/28/2016 showed evidence of metastatic disease in the liver as well as in the lung.   The biopsy from her liver mass confirmed the presence of melanoma but her BRAF testing could not be completed because quantity was not sufficient.  BRAF testing utilizing [employer] 360 platform showed wild-type status.  She is currently receiving Pembrolizumab and have tolerated it reasonably well. The plan is to proceed with cycle 2 of therapy on April 3 and we'll repeat CT scan after April 24 dose.   2. Nausea and vomiting with poor by mouth intake and malnutrition: This continues to improve although she still mildly dehydrated and we'll proceed with IV fluids today.  3. Hypercalcemia: Related to malignancy. She  is status post Zometa on January 11 and her calcium corrected on January 25. Her calcium within normal range.  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 Her echocardiogram and Doppler's did not show any thrombosis. She is currently on Lovenox for long-term treatment.  5. Prognosis: Her performance status remains reasonable and willing to continue with active treatment.  6. Follow-up: Will be on the day of her second cycle on 08/18/2016.   [facility], MD 3/19/201811:08 AM

## 2016-08-18 ENCOUNTER — Ambulatory Visit: Payer: Medicare Other

## 2016-08-18 ENCOUNTER — Other Ambulatory Visit (HOSPITAL_BASED_OUTPATIENT_CLINIC_OR_DEPARTMENT_OTHER): Payer: Medicare Other

## 2016-08-18 ENCOUNTER — Ambulatory Visit (HOSPITAL_BASED_OUTPATIENT_CLINIC_OR_DEPARTMENT_OTHER): Payer: Medicare Other | Admitting: Oncology

## 2016-08-18 ENCOUNTER — Telehealth: Payer: Self-pay | Admitting: Oncology

## 2016-08-18 ENCOUNTER — Ambulatory Visit (HOSPITAL_BASED_OUTPATIENT_CLINIC_OR_DEPARTMENT_OTHER): Payer: Medicare Other

## 2016-08-18 VITALS — BP 139/98 | HR 121 | Resp 18 | Ht 62.0 in | Wt 89.0 lb

## 2016-08-18 DIAGNOSIS — Z7901 Long term (current) use of anticoagulants: Secondary | ICD-10-CM | POA: Diagnosis not present

## 2016-08-18 DIAGNOSIS — Z8673 Personal history of transient ischemic attack (TIA), and cerebral infarction without residual deficits: Secondary | ICD-10-CM | POA: Diagnosis not present

## 2016-08-18 DIAGNOSIS — C7800 Secondary malignant neoplasm of unspecified lung: Secondary | ICD-10-CM | POA: Diagnosis not present

## 2016-08-18 DIAGNOSIS — C439 Malignant melanoma of skin, unspecified: Secondary | ICD-10-CM

## 2016-08-18 DIAGNOSIS — R112 Nausea with vomiting, unspecified: Secondary | ICD-10-CM | POA: Diagnosis not present

## 2016-08-18 DIAGNOSIS — E039 Hypothyroidism, unspecified: Secondary | ICD-10-CM | POA: Diagnosis not present

## 2016-08-18 DIAGNOSIS — C430 Malignant melanoma of lip: Secondary | ICD-10-CM

## 2016-08-18 DIAGNOSIS — C770 Secondary and unspecified malignant neoplasm of lymph nodes of head, face and neck: Secondary | ICD-10-CM | POA: Diagnosis not present

## 2016-08-18 DIAGNOSIS — Z5112 Encounter for antineoplastic immunotherapy: Secondary | ICD-10-CM

## 2016-08-18 DIAGNOSIS — C787 Secondary malignant neoplasm of liver and intrahepatic bile duct: Secondary | ICD-10-CM | POA: Diagnosis not present

## 2016-08-18 DIAGNOSIS — C3401 Malignant neoplasm of right main bronchus: Secondary | ICD-10-CM

## 2016-08-18 DIAGNOSIS — C799 Secondary malignant neoplasm of unspecified site: Secondary | ICD-10-CM

## 2016-08-18 LAB — CBC WITH DIFFERENTIAL/PLATELET
BASO%: 0.1 % (ref 0.0–2.0)
Basophils Absolute: 0 10*3/uL (ref 0.0–0.1)
EOS%: 0 % (ref 0.0–7.0)
Eosinophils Absolute: 0 10*3/uL (ref 0.0–0.5)
HEMATOCRIT: 46.8 % — AB (ref 34.8–46.6)
HEMOGLOBIN: 14.9 g/dL (ref 11.6–15.9)
LYMPH%: 9.3 % — ABNORMAL LOW (ref 14.0–49.7)
MCH: 27 pg (ref 25.1–34.0)
MCHC: 31.8 g/dL (ref 31.5–36.0)
MCV: 84.9 fL (ref 79.5–101.0)
MONO#: 1.1 10*3/uL — ABNORMAL HIGH (ref 0.1–0.9)
MONO%: 8.6 % (ref 0.0–14.0)
NEUT#: 10.4 10*3/uL — ABNORMAL HIGH (ref 1.5–6.5)
NEUT%: 82 % — ABNORMAL HIGH (ref 38.4–76.8)
Platelets: 219 10*3/uL (ref 145–400)
RBC: 5.51 10*6/uL — ABNORMAL HIGH (ref 3.70–5.45)
RDW: 16.8 % — AB (ref 11.2–14.5)
WBC: 12.6 10*3/uL — ABNORMAL HIGH (ref 3.9–10.3)
lymph#: 1.2 10*3/uL (ref 0.9–3.3)

## 2016-08-18 LAB — COMPREHENSIVE METABOLIC PANEL
ALBUMIN: 2.2 g/dL — AB (ref 3.5–5.0)
ALT: 10 U/L (ref 0–55)
AST: 12 U/L (ref 5–34)
Alkaline Phosphatase: 174 U/L — ABNORMAL HIGH (ref 40–150)
Anion Gap: 13 mEq/L — ABNORMAL HIGH (ref 3–11)
BUN: 11.9 mg/dL (ref 7.0–26.0)
CALCIUM: 9.5 mg/dL (ref 8.4–10.4)
CO2: 26 mEq/L (ref 22–29)
Chloride: 100 mEq/L (ref 98–109)
Creatinine: 0.6 mg/dL (ref 0.6–1.1)
EGFR: 90 mL/min/{1.73_m2} — AB (ref >90)
Glucose: 129 mg/dl (ref 70–140)
POTASSIUM: 4.1 meq/L (ref 3.5–5.1)
SODIUM: 140 meq/L (ref 136–145)
Total Bilirubin: 0.46 mg/dL (ref 0.20–1.20)
Total Protein: 6.2 g/dL — ABNORMAL LOW (ref 6.4–8.3)

## 2016-08-18 MED ORDER — SODIUM CHLORIDE 0.9 % IV SOLN
INTRAVENOUS | Status: DC
  Administered 2016-08-18: 12:00:00 via INTRAVENOUS

## 2016-08-18 MED ORDER — SODIUM CHLORIDE 0.9 % IV SOLN: Freq: Once | INTRAVENOUS | Status: DC

## 2016-08-18 MED ORDER — SODIUM CHLORIDE 0.9 % IV SOLN
200.0000 mg | Freq: Once | INTRAVENOUS | Status: AC
  Administered 2016-08-18: 200 mg via INTRAVENOUS
  Filled 2016-08-18: qty 8

## 2016-08-18 NOTE — Telephone Encounter (Signed)
Appointments scheduled per 4.3.18 LOS. Patient given AVS report and calendars with future scheduled appointments.  °

## 2016-08-18 NOTE — Progress Notes (Signed)
Hematology and Oncology Follow Up Visit  Victoria Castro [id number] March 28, 1941 76 y.o. 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 She developed subsequently cervical adenopathy in June 2017. Her final pathological staging was IIIB (TisN2aM0).  She was found to have metastatic melanoma in January 2018. The BRAF wild type.    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 She underwent Mohs procedure in February 2017 which did not show any residual malignancy at that time.  She subsequently started developing a tender mass in her right submandibular region which did not improve with antibiotics. Evaluate her aspiration done on 09/16/2015 showed atypical cells suspicious for malignancy.  PET scan showed right-sided lymphadenopathy at the level of IIa. She subsequently underwent a repeat biopsy on 10/02/2015 which confirmed the presence of metastatic melanoma. She is status post neck dissection performed on 10/22/2015 which showed 2 out of 7 lymph nodes involved with metastatic melanoma. Adjuvant ipilimumab at reduced dose of 3 mg/kg every 3 weeks for a total of 4 doses. She is supposed 4 treatments completed in 03/17/2016. She is status post liver biopsy on 06/11/2016. The biopsy confirmed the presence of metastatic melanoma with BRAF testing still pending.  Current therapy: Pembrolizumab 200 mg every 3 weeks first dose given on 07/28/2016. She is here for cycle 2 of therapy.  Interim History: Victoria Castro presents today for a follow-up visit. Since the last visit, she reports no major changes in her health.  She is able to eat and drink better at this time. Despite her increase by mouth intake, her weight has dropped slightly. Her ambulation is still limited but has not reported any falls or  syncope. She does report periodic dyspnea on exertion. She denied any dysphagia or difficulty swallowing. Her performance status and quality of life slightly improved at this time.   She is ready to proceed with cycle 2 of Pembrolizumab after receiving the first cycle without complications. She denied any dermatitis or skin rashes. She denied any pruritus.   She does not report any neurological symptoms. She denied any syncope or seizures. She denied any fevers or chills or sweats. She does not report any wheezing or hemoptysis. She does not report any chest pain, palpitation, orthopnea or leg edema. She does not report any frequency urgency or hesitancy. She does not report any skeletal complaints. Remaining review of systems unremarkable.   Medications: I have reviewed the patient's current medications.  Current Outpatient Prescriptions  Medication Sig Dispense Refill  . amLODipine-benazepril (LOTREL) 5-20 MG capsule Take 1 capsule by mouth daily. Hold for SBP less than 130 10 capsule 0  . enoxaparin (LOVENOX) 40 MG/0.4ML injection Inject 0.4 mLs (40 mg total) into the skin every 12 (twelve) hours. 60 Syringe 0  . levothyroxine (SYNTHROID, LEVOTHROID) 50 MCG tablet Take 50 mcg by mouth daily before breakfast.    . mirtazapine (REMERON) 15 MG tablet Take 1 tablet (15 mg total) by mouth at bedtime. 30 tablet 1  . ondansetron (ZOFRAN ODT) 8 MG disintegrating tablet Take 1 tablet (8 mg total) by mouth every 8 (eight) hours as needed for nausea or vomiting. 30 tablet 1  . prochlorperazine (COMPAZINE) 10 MG tablet Take 1 tablet (10 mg total) by mouth every 6 (six) hours as needed for nausea or vomiting. 30 tablet 1  .  promethazine (PHENERGAN) 25 MG tablet Take 25 mg by mouth every 6 (six) hours as needed for nausea or vomiting.     No current facility-administered medications for this visit.    Facility-Administered Medications Ordered in Other Visits  Medication Dose Route Frequency Provider Last  Rate Last Dose  . 0.9 %  sodium chloride infusion   Intravenous Once Victoria Portela, MD         Allergies: No Known Allergies  Past Medical History, Surgical history, Social history, and Family History were reviewed and updated.   Physical Exam: Blood pressure (!) 139/98, pulse (!) 121, resp. rate 18, height 5' 2"  (1.575 m), weight 89 lb (40.4 kg), SpO2 99 %. ECOG: 1 General appearance: Alert, awake woman without distress. Appeared frail. Head: Normocephalic, without obvious abnormality. No oral thrush or ulcers. Neck: no adenopathy Lymph nodes: Cervical, supraclavicular, and axillary nodes normal. Heart:regular rate and rhythm, S1, S2 normal, no murmur, click, rub or gallop Lung:chest clear, no wheezing, rales, normal symmetric air entry. No dullness to percussion. Abdomin: soft, non-tender, without masses or organomegaly no shifting dullness or ascites. EXT:no erythema, induration, or nodules no edema noted. Neurological examination: No deficits noted.  Lab Results: Lab Results  Component Value Date   WBC 12.6 (H) 08/18/2016   HGB 14.9 08/18/2016   HCT 46.8 (H) 08/18/2016   MCV 84.9 08/18/2016   PLT 219 08/18/2016     Chemistry      Component Value Date/Time   NA 140 08/03/2016 1019   K 4.2 08/03/2016 1019   CL 96 (L) 07/30/2016 2005   CL 100 02/21/2014 0902   CO2 26 08/03/2016 1019   BUN 12.6 08/03/2016 1019   CREATININE 0.6 08/03/2016 1019      Component Value Date/Time   CALCIUM 9.7 08/03/2016 1019   ALKPHOS 161 (H) 08/03/2016 1019   AST 13 08/03/2016 1019   ALT 11 08/03/2016 1019   BILITOT 0.36 08/03/2016 1019      Impression and Plan:  76 year old woman with the following issues:  1. 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 She was initially found to have a lesion on the right upper lip and subsequently developed cervical adenopathy with the final pathological staging as IIIB (TisN2aM0). She is status post Mohs procedure followed  by neck dissection completed in June 2017.  She is S/P adjuvant therapy utilizing ipilimumab at 3 mg/kg every 3 weeks. She is status post 4 treatments completed without acute toxicities. She did develop delayed toxicity including hypothyroidism which she is currently being treated for.  PET CT scan obtained on 05/28/2016 showed evidence of metastatic disease in the liver as well as in the lung.   The biopsy from her liver mass confirmed the presence of melanoma but her BRAF testing could not be completed because quantity was not sufficient.  BRAF testing utilizing [employer] 360 platform showed wild-type status.  She is currently receiving Pembrolizumab and have tolerated it reasonably well. The plan is to proceed with cycle 2 of therapy without a dose reduction or delay. We will repeat imaging studies after cycle 3 of therapy.  We have discussed also the role for supportive care only if she decides to stop treatment at any time. She understands that she has an incurable malignancy and any treatment at this point is palliative at best.   2. Nausea and vomiting with poor by mouth intake and malnutrition: This continues to improve and she will receive hydration with 500 mL of normal saline  with her treatment today.  3. Hypercalcemia: Related to malignancy. She is status post Zometa on January 11 and her calcium remain within normal range of 08/03/2016.  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 Her echocardiogram and Doppler's did not show any thrombosis. She is currently on Lovenox for long-term treatment.  5. Prognosis: Poor with limited life expectancy. Her performance status is reasonable at this time and she continues to be interested in palliative systemic therapy. But she would be a candidate for hospice if she decides to stop therapy.  6. Follow-up: Will be on 2 weeks for the next cycle of therapy.   [facility], MD 4/3/201810:21 AM

## 2016-08-31 ENCOUNTER — Ambulatory Visit: Payer: Self-pay | Admitting: Neurology

## 2016-08-31 ENCOUNTER — Telehealth: Payer: Self-pay | Admitting: *Deleted

## 2016-08-31 NOTE — Telephone Encounter (Signed)
Called and spoke with patient's daughter. Advised appt cx today due to power out at office. Daughter will call after 1pm today or tomorrow to reschedule her appt.

## 2016-09-01 ENCOUNTER — Other Ambulatory Visit: Payer: Self-pay | Admitting: Oncology

## 2016-09-01 NOTE — Telephone Encounter (Signed)
Rn call patients daughter Karna Christmas about her moms appt having to be reschedule because of office being closed due to no electricity. Rn stated Dr. Erlinda Hong will see her mom at 1200pm because of the office being closed during his lunch time. Terri requested a call back tomorrow to see her schedule. Rn stated the appt can be in two weeks and it would have to be the week of April 30, May 2,2018. Terri stated she will call back.

## 2016-09-03 NOTE — Telephone Encounter (Signed)
Rn call patients daughter about r/s her mom from 08/31/2016 when the office was closed due to no power. Rn stated Dr. Erlinda Hong will see his mom at 1200pm because of the having to r/s. Rn remind pts daughter to check in at 64. Terri verbalized understanding.

## 2016-09-08 ENCOUNTER — Other Ambulatory Visit (HOSPITAL_BASED_OUTPATIENT_CLINIC_OR_DEPARTMENT_OTHER): Payer: Medicare Other

## 2016-09-08 ENCOUNTER — Ambulatory Visit: Payer: Medicare Other

## 2016-09-08 ENCOUNTER — Ambulatory Visit (HOSPITAL_BASED_OUTPATIENT_CLINIC_OR_DEPARTMENT_OTHER): Payer: Medicare Other | Admitting: Oncology

## 2016-09-08 ENCOUNTER — Telehealth: Payer: Self-pay | Admitting: Oncology

## 2016-09-08 ENCOUNTER — Ambulatory Visit (HOSPITAL_BASED_OUTPATIENT_CLINIC_OR_DEPARTMENT_OTHER): Payer: Medicare Other

## 2016-09-08 VITALS — BP 118/75 | HR 129 | Resp 18 | Ht 62.0 in | Wt 85.0 lb

## 2016-09-08 VITALS — BP 130/76 | HR 96 | Resp 20

## 2016-09-08 DIAGNOSIS — C799 Secondary malignant neoplasm of unspecified site: Secondary | ICD-10-CM

## 2016-09-08 DIAGNOSIS — C787 Secondary malignant neoplasm of liver and intrahepatic bile duct: Secondary | ICD-10-CM

## 2016-09-08 DIAGNOSIS — C439 Malignant melanoma of skin, unspecified: Secondary | ICD-10-CM

## 2016-09-08 DIAGNOSIS — C770 Secondary and unspecified malignant neoplasm of lymph nodes of head, face and neck: Secondary | ICD-10-CM

## 2016-09-08 DIAGNOSIS — C430 Malignant melanoma of lip: Secondary | ICD-10-CM | POA: Diagnosis not present

## 2016-09-08 DIAGNOSIS — C7800 Secondary malignant neoplasm of unspecified lung: Secondary | ICD-10-CM

## 2016-09-08 DIAGNOSIS — Z5112 Encounter for antineoplastic immunotherapy: Secondary | ICD-10-CM | POA: Diagnosis not present

## 2016-09-08 DIAGNOSIS — Z8673 Personal history of transient ischemic attack (TIA), and cerebral infarction without residual deficits: Secondary | ICD-10-CM | POA: Diagnosis not present

## 2016-09-08 DIAGNOSIS — Z7901 Long term (current) use of anticoagulants: Secondary | ICD-10-CM

## 2016-09-08 LAB — CBC WITH DIFFERENTIAL/PLATELET
BASO%: 0.1 % (ref 0.0–2.0)
BASOS ABS: 0 10*3/uL (ref 0.0–0.1)
EOS ABS: 0 10*3/uL (ref 0.0–0.5)
EOS%: 0.1 % (ref 0.0–7.0)
HCT: 44.4 % (ref 34.8–46.6)
HEMOGLOBIN: 14.2 g/dL (ref 11.6–15.9)
LYMPH%: 15.8 % (ref 14.0–49.7)
MCH: 27.1 pg (ref 25.1–34.0)
MCHC: 32 g/dL (ref 31.5–36.0)
MCV: 84.7 fL (ref 79.5–101.0)
MONO#: 0.9 10*3/uL (ref 0.1–0.9)
MONO%: 7.9 % (ref 0.0–14.0)
NEUT%: 76.1 % (ref 38.4–76.8)
NEUTROS ABS: 8.5 10*3/uL — AB (ref 1.5–6.5)
Platelets: 191 10*3/uL (ref 145–400)
RBC: 5.24 10*6/uL (ref 3.70–5.45)
RDW: 17.1 % — ABNORMAL HIGH (ref 11.2–14.5)
WBC: 11.2 10*3/uL — ABNORMAL HIGH (ref 3.9–10.3)
lymph#: 1.8 10*3/uL (ref 0.9–3.3)

## 2016-09-08 LAB — COMPREHENSIVE METABOLIC PANEL
ALBUMIN: 2.5 g/dL — AB (ref 3.5–5.0)
ALK PHOS: 164 U/L — AB (ref 40–150)
ALT: 7 U/L (ref 0–55)
ANION GAP: 17 meq/L — AB (ref 3–11)
AST: 13 U/L (ref 5–34)
BILIRUBIN TOTAL: 0.75 mg/dL (ref 0.20–1.20)
BUN: 8 mg/dL (ref 7.0–26.0)
CO2: 28 mEq/L (ref 22–29)
Calcium: 9.7 mg/dL (ref 8.4–10.4)
Chloride: 93 mEq/L — ABNORMAL LOW (ref 98–109)
Creatinine: 0.5 mg/dL — ABNORMAL LOW (ref 0.6–1.1)
GLUCOSE: 111 mg/dL (ref 70–140)
POTASSIUM: 4 meq/L (ref 3.5–5.1)
SODIUM: 138 meq/L (ref 136–145)
TOTAL PROTEIN: 5.8 g/dL — AB (ref 6.4–8.3)

## 2016-09-08 MED ORDER — SODIUM CHLORIDE 0.9 % IV SOLN
INTRAVENOUS | Status: DC
  Administered 2016-09-08: 09:00:00 via INTRAVENOUS

## 2016-09-08 MED ORDER — SODIUM CHLORIDE 0.9 % IV SOLN
200.0000 mg | Freq: Once | INTRAVENOUS | Status: AC
  Administered 2016-09-08: 200 mg via INTRAVENOUS
  Filled 2016-09-08: qty 8

## 2016-09-08 NOTE — Addendum Note (Signed)
Addended by: Randolm Idol on: 09/08/2016 09:01 AM   Modules accepted: Orders

## 2016-09-08 NOTE — Progress Notes (Signed)
Nutrition Follow-up:  Nutrition follow-up completed in infusion room. Daughter at bedside.  Patient is a retired [employer] scientist and daughter is a Marine scientist and currently working in Education administrator for [employer].  Patient lives with daughter.    Daughter reports she is trying to maximize calories and protein in what her mom will eat.  Mother does not like ensure/boost supplements.  Reports yesterday had few bites of egg sandwich, 3-4 bites of creamy tomato soup, peaches with whole milk and few bites of milkshake.  Daughter reports patient is only able to eat few bites at a time and she offers food about every 2-3 hours.  Daughter reports thyroid has been off and have added synthroid recently.    Daughter talked about not wanting to do nutrition support but allowing patient to eat what she can eat, when she can eat it at this time.     Medications: remeron  Labs: reviewed  Anthropometrics:   Noted weight today of 85 pounds, decreased from weight of 103 pounds on 2/7.   NUTRITION DIAGNOSIS: Unintentional weight loss continues   MALNUTRITION DIAGNOSIS: Severe malnutrition continues   INTERVENTION:   Encouraged daughter and patient to continue with high calorie, high protein foods. Encouraged continuing with small frequent meals.   Daughter and patient can contact me with questions or concerns, if needed    MONITORING, EVALUATION, GOAL: Patient will consume adequate calories and protein to prevent further weight loss   NEXT VISIT: as needed  Ramsha Lonigro B. Zenia Resides, [place], [place] Registered Dietitian [phone] (pager)

## 2016-09-08 NOTE — Telephone Encounter (Signed)
Gave patient calender per 4/24 los - appts already scheduled per 4/24 no additional appts needed. - patient okay with afternoon appts.

## 2016-09-08 NOTE — Patient Instructions (Signed)
[facility] Discharge Instructions for Patients Receiving Chemotherapy  Today you received the following chemotherapy agents: Keytruda   To help prevent nausea and vomiting after your treatment, we encourage you to take your nausea medication as directed    If you develop nausea and vomiting that is not controlled by your nausea medication, call the clinic.   BELOW ARE SYMPTOMS THAT SHOULD BE REPORTED IMMEDIATELY:  *FEVER GREATER THAN 100.5 F  *CHILLS WITH OR WITHOUT FEVER  NAUSEA AND VOMITING THAT IS NOT CONTROLLED WITH YOUR NAUSEA MEDICATION  *UNUSUAL SHORTNESS OF BREATH  *UNUSUAL BRUISING OR BLEEDING  TENDERNESS IN MOUTH AND THROAT WITH OR WITHOUT PRESENCE OF ULCERS  *URINARY PROBLEMS  *BOWEL PROBLEMS  UNUSUAL RASH Items with * indicate a potential emergency and should be followed up as soon as possible.  Feel free to call the clinic you have any questions or concerns. The clinic phone number is [phone].  Please show the CHEMO ALERT CARD at check-in to the Emergency Department and triage nurse.   

## 2016-09-08 NOTE — Progress Notes (Signed)
Hematology and Oncology Follow Up Visit  Victoria Castro [id number] Oct 14, 1940 76 y.o. 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 She developed subsequently cervical adenopathy in June 2017. Her final pathological staging was IIIB ([id number]).  She was found to have metastatic melanoma in January 2018. The BRAF wild type.    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 She underwent Mohs procedure in February 2017 which did not show any residual malignancy at that time.  She subsequently started developing a tender mass in her right submandibular region which did not improve with antibiotics. Evaluate her aspiration done on 09/16/2015 showed atypical cells suspicious for malignancy.  PET scan showed right-sided lymphadenopathy at the level of IIa. She subsequently underwent a repeat biopsy on 10/02/2015 which confirmed the presence of metastatic melanoma. She is status post neck dissection performed on 10/22/2015 which showed 2 out of 7 lymph nodes involved with metastatic melanoma. Adjuvant ipilimumab at reduced dose of 3 mg/kg every 3 weeks for a total of 4 doses. She is supposed 4 treatments completed in 03/17/2016. She is status post liver biopsy on 06/11/2016. The biopsy confirmed the presence of metastatic melanoma with BRAF testing still pending.  Current therapy: Pembrolizumab 200 mg every 3 weeks first dose given on 07/28/2016. She is here for cycle 3 of therapy.  Interim History: Victoria Castro presents today for a follow-up visit accompanied by her daughter. Since the last visit, she reports no new complaints. She reports that her dyspnea on exertion has improved slightly. Her exercise tolerance have also improved. She reports that she is able to perform certain activities including  dressing and bathing without been very dyspneic. She is still quite frail and requires a lot of assistance from her daughter.   Her by mouth intake remains poor and of lost more pounds since last visit. She enjoys certain foods and is able to eat and drink nutritional supplements. She denied any discomfort including no headaches, chest pain or abdominal pain.  She has tolerated Pembrolizumab without any recent complications. She denied any dermatitis or skin rashes. She denied any pruritus. She denied any nausea or vomiting. She is ready to proceed with the next cycle of therapy.   She does not report any neurological symptoms. She denied any syncope or seizures. She denied any fevers or chills or sweats. She does not report any wheezing or hemoptysis. She does not report any chest pain, palpitation, orthopnea or leg edema. She does not report any frequency urgency or hesitancy. She does not report any skeletal complaints. Remaining review of systems unremarkable.   Medications: I have reviewed the patient's current medications.  Current Outpatient Prescriptions  Medication Sig Dispense Refill  . amLODipine-benazepril (LOTREL) 5-20 MG capsule Take 1 capsule by mouth daily. Hold for SBP less than 130 10 capsule 0  . enoxaparin (LOVENOX) 40 MG/0.4ML injection INJECT 0.4 MLS (40 MG TOTAL) INTO THE SKIN EVERY 12 (TWELVE) HOURS. 24 Syringe 0  . levothyroxine (SYNTHROID, LEVOTHROID) 50 MCG tablet Take 50 mcg by mouth daily before breakfast.    . mirtazapine (REMERON) 15 MG tablet Take 1 tablet (15 mg total) by mouth at bedtime. 30 tablet 1  . ondansetron (ZOFRAN ODT) 8 MG disintegrating tablet Take 1 tablet (8 mg total) by mouth every 8 (eight) hours as needed for nausea  or vomiting. 30 tablet 1  . prochlorperazine (COMPAZINE) 10 MG tablet Take 1 tablet (10 mg total) by mouth every 6 (six) hours as needed for nausea or vomiting. 30 tablet 1  . promethazine (PHENERGAN) 25 MG tablet Take 25 mg by mouth  every 6 (six) hours as needed for nausea or vomiting.     No current facility-administered medications for this visit.    Facility-Administered Medications Ordered in Other Visits  Medication Dose Route Frequency Provider Last Rate Last Dose  . 0.9 %  sodium chloride infusion   Intravenous Once Wyatt Portela, MD         Allergies: No Known Allergies  Past Medical History, Surgical history, Social history, and Family History were reviewed and updated.   Physical Exam: Blood pressure 118/75, pulse (!) 129, resp. rate 18, height 5' 2"  (1.575 m), weight 85 lb (38.6 kg), SpO2 99 %. ECOG: 2 General appearance: Well appearing woman without distress. Head: Normocephalic, without obvious abnormality. No oral thrush or ulcers. Neck: no adenopathy Lymph nodes: Cervical, supraclavicular, and axillary nodes normal. Heart:regular rate and rhythm, S1, S2 normal, no murmur, click, rub or gallop Lung:chest clear, no wheezing, rales, normal symmetric air entry. No dullness to percussion. Abdomin: soft, non-tender, without masses or organomegaly no rebound or guarding. EXT:no erythema, induration, or nodules no edema noted. Neurological examination: No deficits noted.  Lab Results: Lab Results  Component Value Date   WBC 11.2 (H) 09/08/2016   HGB 14.2 09/08/2016   HCT 44.4 09/08/2016   MCV 84.7 09/08/2016   PLT 191 09/08/2016     Chemistry      Component Value Date/Time   NA 138 09/08/2016 0803   K 4.0 09/08/2016 0803   CL 96 (L) 07/30/2016 2005   CL 100 02/21/2014 0902   CO2 28 09/08/2016 0803   BUN 8.0 09/08/2016 0803   CREATININE 0.5 (L) 09/08/2016 0803      Component Value Date/Time   CALCIUM 9.7 09/08/2016 0803   ALKPHOS 164 (H) 09/08/2016 0803   AST 13 09/08/2016 0803   ALT 7 09/08/2016 0803   BILITOT 0.75 09/08/2016 0803      Impression and Plan:  76 year old woman with the following issues:  1. 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 She  was initially found to have a lesion on the right upper lip and subsequently developed cervical adenopathy with the final pathological staging as IIIB (TisN2aM0). She is status post Mohs procedure followed by neck dissection completed in June 2017.  She is S/P adjuvant therapy utilizing ipilimumab at 3 mg/kg every 3 weeks. She is status post 4 treatments completed without acute toxicities. She did develop delayed toxicity including hypothyroidism which she is currently being treated for.  PET CT scan obtained on 05/28/2016 showed evidence of metastatic disease in the liver as well as in the lung.   The biopsy from her liver mass confirmed the presence of melanoma but her BRAF testing could not be completed because quantity was not sufficient.  BRAF testing utilizing [employer] 360 platform showed wild-type status.  She is currently receiving Pembrolizumab and have tolerated it reasonably well. The plan is to proceed with cycle 3 of therapy without a dose reduction or delay. We will repeat imaging studies after cycle 4 of therapy.  Risks and benefits of continuing this therapy was also discussed today and she opted to continue with treatment. She believes that she is benefiting clinically at this time and would like to continue for  the time being.   2. Poor by mouth intake: This continues to improve and she will receive hydration with 500 mL of normal saline with her treatment today.  3. Hypercalcemia: Related to malignancy. She is status post Zometa on January 11 and her calcium remain within normal range from previous checks.  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 Her echocardiogram and Doppler's did not show any thrombosis. She is currently on Lovenox for long-term treatment.  5. Prognosis: Poor with limited life expectancy. Her performance status is declining. If she progresses on this therapy, hospice will be her next option.  6. Follow-up: Will be on 3 weeks for the next cycle  of therapy.   [facility], MD 4/24/20188:43 AM

## 2016-09-14 ENCOUNTER — Ambulatory Visit: Payer: Self-pay | Admitting: Neurology

## 2016-09-29 ENCOUNTER — Ambulatory Visit: Payer: Medicare Other

## 2016-09-29 ENCOUNTER — Ambulatory Visit: Payer: Medicare Other | Admitting: Oncology

## 2016-09-29 ENCOUNTER — Other Ambulatory Visit: Payer: Medicare Other

## 2016-09-30 ENCOUNTER — Other Ambulatory Visit: Payer: Self-pay | Admitting: Oncology

## 2016-10-14 ENCOUNTER — Other Ambulatory Visit: Payer: Medicare Other

## 2016-10-14 ENCOUNTER — Ambulatory Visit: Payer: Medicare Other | Admitting: Oncology

## 2016-10-14 ENCOUNTER — Telehealth: Payer: Self-pay | Admitting: *Deleted

## 2016-10-14 NOTE — Telephone Encounter (Signed)
Called and spoke with daughter,Terri, regarding referral to [facility]. Informed her that Dr. Alen Blew is OK with this decision. Referral made. Terri verbalized understanding.

## 2016-10-14 NOTE — Telephone Encounter (Signed)
Ok to make referral to hospice. No need to reschedule

## 2016-10-14 NOTE — Telephone Encounter (Signed)
Per daughter Gerre Couch, patient is to weak to make today's appointments. Please ask Dr. Alen Blew," if he is OK with making a Palliative care/Hospice referral. Mom is not ready to quit yet. She struggles with eating and drinking, and she is wasting away. 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 Alen Blew and call her back. Best number is [phone]. Also, she has moved her mom in with her. New address is [address] [place], [place] [address] [place]).

## 2016-10-15 ENCOUNTER — Other Ambulatory Visit: Payer: Self-pay | Admitting: Oncology

## 2016-10-20 NOTE — Telephone Encounter (Signed)
This RN called [facility] to inquire what decision the family made regarding Hospice or Palliative care. Patient has been admitted to Hospice this morning. Instructed them I would inform Dr. Alen Blew.

## 2016-10-21 ENCOUNTER — Telehealth: Payer: Self-pay | Admitting: *Deleted

## 2016-10-21 ENCOUNTER — Encounter: Payer: Self-pay | Admitting: *Deleted

## 2016-10-21 NOTE — Telephone Encounter (Signed)
Signed Plan of care faxed to [facility] [phone]

## 2016-10-23 ENCOUNTER — Telehealth: Payer: Self-pay | Admitting: *Deleted

## 2016-10-23 NOTE — Telephone Encounter (Signed)
rec'd call from [facility]. Patient and daughter have decided to  Have hospice care. Okay to use previous orders. Yes.

## 2016-10-23 NOTE — Telephone Encounter (Signed)
"  Rosalyn Charters RN with [facility]  Just admitted patient.  Her daughter feels mom has changed quite a bit, needs potassium cheked today."    Requested further assessment.  Attending Hospice provider returns 10-27-2016.  [facility] lab closed at this time.    "She can stand and pivote to commode but is weak.  Potassium has not been checked in a month.  Daughter feels K+ level may have dropped because she gets nauseated and vomits fairly often.  Throws up taking medicines or when she tries to eat.  No diarrhea."

## 2016-10-26 ENCOUNTER — Other Ambulatory Visit: Payer: Self-pay | Admitting: *Deleted

## 2016-10-26 MED ORDER — MIRTAZAPINE 15 MG PO TABS: 15.0000 mg | ORAL_TABLET | Freq: Every day | ORAL | 1 refills | Status: AC

## 2016-11-02 ENCOUNTER — Telehealth: Payer: Self-pay

## 2016-11-02 NOTE — Telephone Encounter (Signed)
Call from [place] with hospice [facility] stating pt died at 75 am on 2016/11/14. Her number is [phone]

## 2016-11-04 ENCOUNTER — Telehealth: Payer: Self-pay | Admitting: Oncology

## 2016-11-04 NOTE — Telephone Encounter (Signed)
Faxed records to [facility] [phone]

## 2016-11-15 DEATH — deceased

## 2016-12-30 ENCOUNTER — Other Ambulatory Visit: Payer: Self-pay | Admitting: Nurse Practitioner

## 2018-09-14 IMAGING — CT CT ANGIO NECK
2 of 12 series · 6 of 34 positions shown · IV contrast (ISOVUE 370)
Comparison: MRI of the head July 02, 2016 and PET CT Chandimal

CLINICAL DATA: Nausea for several weeks. History of metastatic
melanoma, hypertension. Follow-up acute posterior circulation
infarcts.

EXAM:
CT ANGIOGRAPHY HEAD AND NECK
TECHNIQUE: Multidetector CT imaging of the head and neck was performed using
the standard protocol during bolus administration of intravenous
contrast. Multiplanar CT image reconstructions and MIPs were
obtained to evaluate the vascular anatomy. Carotid stenosis
measurements (when applicable) are obtained utilizing NASCET
criteria, using the distal internal carotid diameter as the
denominator.
CONTRAST:  80 cc Isovue 370

[Series 10: sagittal head · sagittal · 0.30mm/px · 1 of 48 slices shown]
[im 13/48  soft-tissue]
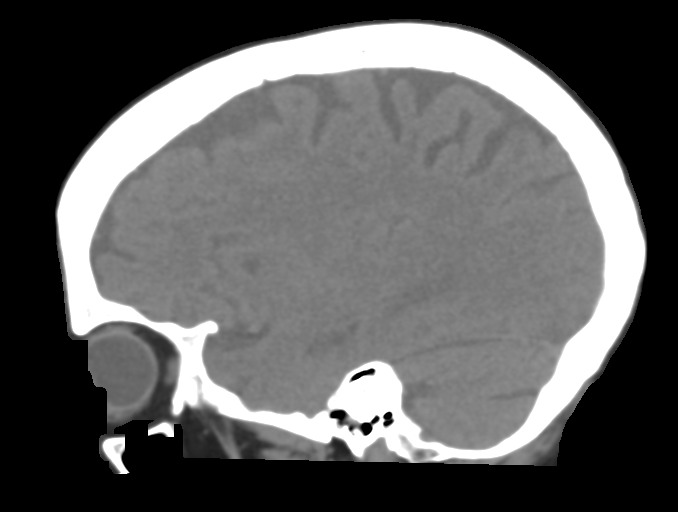

[Series 12: axial thin · axial · 0.39mm/px · z∈[-324,-118]mm · 5 of 307 slices shown]
[im 52/307  soft-tissue]
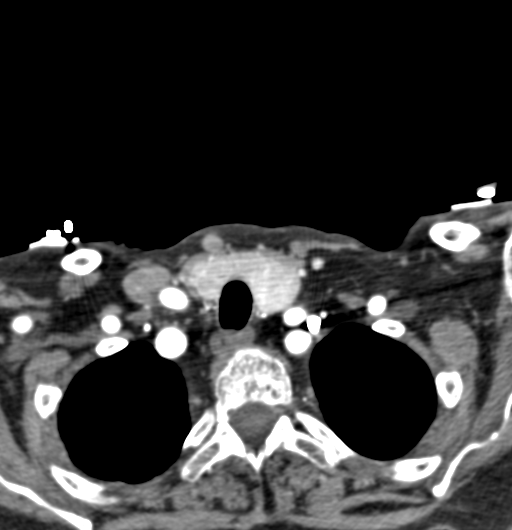
[im 103/307  bone]
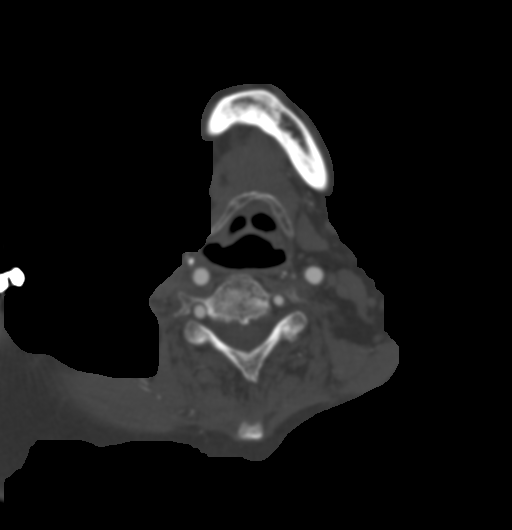
[im 154/307  soft-tissue]
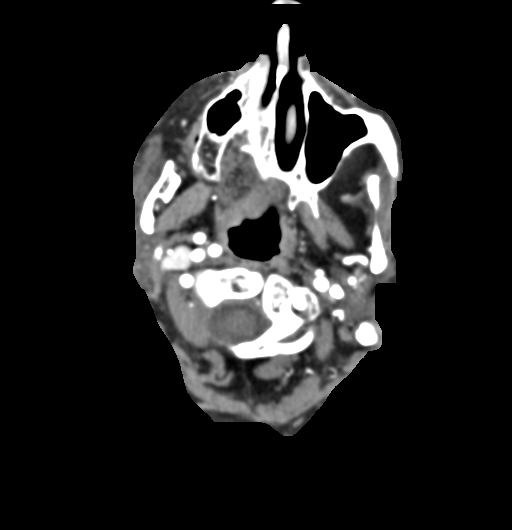
[im 205/307  bone]
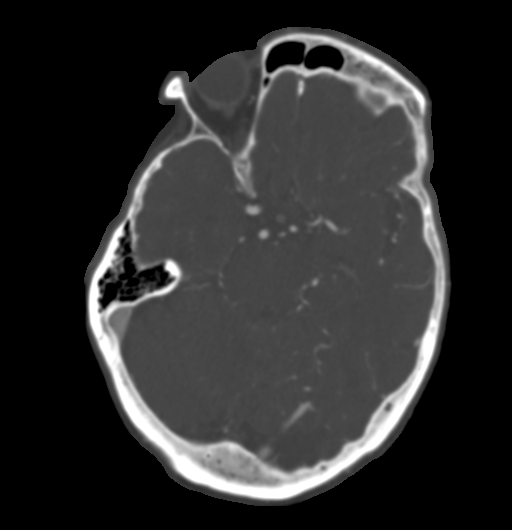
[im 256/307  soft-tissue]
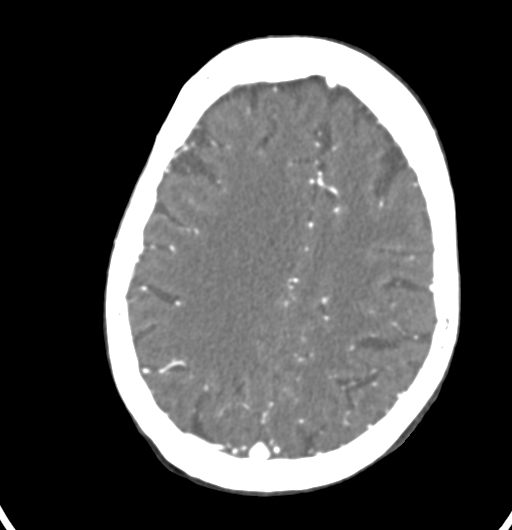

[6 of 34 positions shown; findings below may reference images not displayed]

FINDINGS: CT HEAD FINDINGS

BRAIN: Wedge-like hypodensity LEFT temporal and occipital lobes
corresponding to acute infarct. Patchy hypodensities in the LEFT
inferior cerebellum. Focal hypodensity cyst RIGHT sagittal splenium
of the corpus callosum. Ventricles and sulci are normal for
patient's age. No intraparenchymal hemorrhage, mass effect. Old LEFT
basal ganglia lacunar infarct. Ventricles and sulci are normal for
patient's age. No abnormal extra-axial fluid collections.

VASCULAR: Mild calcific atherosclerosis of the carotid siphons.

SKULL: No skull fracture. No significant scalp soft tissue swelling.

SINUSES/ORBITS: The mastoid air-cells and included paranasal sinuses
are well-aerated. Status post bilateral ocular lens implants. The
included ocular globes and orbital contents are non-suspicious.

OTHER: None.

CTA NECK

AORTIC ARCH: Mild calcific atherosclerosis of the aortic arch, 2
vessel arch is a normal variant. Mild stenosis LEFT subclavian
artery origin due to eccentric calcific atherosclerosis. Mild
stenosis proximal RIGHT subclavian artery. Origins of the
innominate, left Common carotid artery and subclavian artery are
widely patent.

RIGHT CAROTID SYSTEM: Common carotid artery is widely patent,
coursing in a straight line fashion. Mild eccentric calcific
atherosclerosis of the carotid bifurcation without hemodynamically
significant stenosis by NASCET criteria. Normal appearance of the
included internal carotid artery.

LEFT CAROTID SYSTEM: Common carotid artery is widely patent,
coursing in a straight line fashion. Mild eccentric calcific
atherosclerosis of the carotid bifurcation without hemodynamically
significant stenosis by NASCET criteria. Normal appearance of the
included internal carotid artery.

VERTEBRAL ARTERIES:RIGHT vertebral artery is dominant. Normal
appearance of the vertebral arteries, which appear widely patent.

SKELETON: No acute osseous process though bone windows have not been
submitted. Osteopenia. Moderate degenerative changes cervical spine
without destructive bony lesions. Patient is edentulous.

OTHER NECK: Re- demonstration of LEFT LEFT aortopulmonary window
pathologic lymphadenopathy at 3.2 cm. Heterogeneous thyroid gland
without dominant nodule.

CTA HEAD

ANTERIOR CIRCULATION: Normal appearance of the cervical internal
carotid arteries, petrous, cavernous and supra clinoid internal
carotid arteries. Widely patent anterior communicating artery.
Moderate stenosis RIGHT A1 origin. Patent anterior communicating
artery. Patent anterior cerebral artery does mild luminal
irregularity. Tandem mild to moderate stenoses bilateral mid and
distal segments.

No large vessel occlusion, hemodynamically significant stenosis,
dissection, luminal irregularity, contrast extravasation or
aneurysm.

POSTERIOR CIRCULATION: Patent vertebral arteries, vertebrobasilar
junction and basilar artery, as well as main branch vessels. Mild
stenosis mid basilar artery. Patent posterior cerebral arteries.
Moderate tandem stenoses bilateral posterior cerebral arteries.

No large vessel occlusion, hemodynamically significant stenosis,
dissection, luminal irregularity, contrast extravasation or
aneurysm.

VENOUS SINUSES: Major dural venous sinuses are patent though not
tailored for evaluation on this angiographic examination.

ANATOMIC VARIANTS: None.

DELAYED PHASE: No abnormal intracranial enhancement.
IMPRESSION: CT HEAD: Acute LEFT greater than RIGHT posterior circulation
infarcts, no hemorrhagic conversion.

Old LEFT cerebellum and LEFT basal ganglia infarcts.

CTA NECK: Atherosclerosis without hemodynamically significant
stenosis or acute vascular process.

Mediastinal lymphadenopathy better characterized on PET- CT Chandimal

CTA HEAD:  No emergent large vessel or occlusion or severe stenosis.

Moderate intracranial atherosclerosis and scattered stenoses.
